# Patient Record
Sex: Female | Born: 1935 | Race: White | Hispanic: No | Smoking: Never smoker
Health system: Southern US, Community
[De-identification: ages and names within clinical notes are randomized; demographics above are authoritative.]

## PROBLEM LIST (undated history)

## (undated) DIAGNOSIS — K219 Gastro-esophageal reflux disease without esophagitis: Secondary | ICD-10-CM

## (undated) DIAGNOSIS — N189 Chronic kidney disease, unspecified: Secondary | ICD-10-CM

## (undated) DIAGNOSIS — E871 Hypo-osmolality and hyponatremia: Secondary | ICD-10-CM

## (undated) DIAGNOSIS — J4489 Other specified chronic obstructive pulmonary disease: Secondary | ICD-10-CM

## (undated) DIAGNOSIS — Z8719 Personal history of other diseases of the digestive system: Secondary | ICD-10-CM

## (undated) DIAGNOSIS — I201 Angina pectoris with documented spasm: Secondary | ICD-10-CM

## (undated) DIAGNOSIS — C189 Malignant neoplasm of colon, unspecified: Secondary | ICD-10-CM

## (undated) DIAGNOSIS — J449 Chronic obstructive pulmonary disease, unspecified: Secondary | ICD-10-CM

## (undated) DIAGNOSIS — J479 Bronchiectasis, uncomplicated: Secondary | ICD-10-CM

## (undated) DIAGNOSIS — I701 Atherosclerosis of renal artery: Secondary | ICD-10-CM

## (undated) DIAGNOSIS — I10 Essential (primary) hypertension: Secondary | ICD-10-CM

## (undated) HISTORY — DX: Angina pectoris with documented spasm: I20.1

## (undated) HISTORY — DX: Chronic obstructive pulmonary disease, unspecified: J44.9

## (undated) HISTORY — DX: Bronchiectasis, uncomplicated: J47.9

## (undated) HISTORY — DX: Hypo-osmolality and hyponatremia: E87.1

## (undated) HISTORY — DX: Essential (primary) hypertension: I10

## (undated) HISTORY — DX: Atherosclerosis of renal artery: I70.1

## (undated) HISTORY — DX: Malignant neoplasm of colon, unspecified: C18.9

## (undated) HISTORY — DX: Other specified chronic obstructive pulmonary disease: J44.89

## (undated) HISTORY — DX: Gastro-esophageal reflux disease without esophagitis: K21.9

## (undated) HISTORY — DX: Personal history of other diseases of the digestive system: Z87.19

## (undated) HISTORY — DX: Chronic kidney disease, unspecified: N18.9

---

## 2002-10-12 ENCOUNTER — Emergency Department (HOSPITAL_COMMUNITY): Admission: EM | Admit: 2002-10-12 | Discharge: 2002-10-12 | Payer: Self-pay | Admitting: Emergency Medicine

## 2002-10-12 ENCOUNTER — Encounter: Payer: Self-pay | Admitting: Emergency Medicine

## 2002-11-14 ENCOUNTER — Ambulatory Visit (HOSPITAL_COMMUNITY): Admission: RE | Admit: 2002-11-14 | Discharge: 2002-11-14 | Payer: Self-pay | Admitting: *Deleted

## 2002-11-14 ENCOUNTER — Encounter: Payer: Self-pay | Admitting: *Deleted

## 2003-03-05 ENCOUNTER — Inpatient Hospital Stay (HOSPITAL_COMMUNITY): Admission: EM | Admit: 2003-03-05 | Discharge: 2003-03-08 | Payer: Self-pay | Admitting: Emergency Medicine

## 2003-03-06 ENCOUNTER — Encounter: Payer: Self-pay | Admitting: Cardiology

## 2003-04-15 ENCOUNTER — Encounter: Admission: RE | Admit: 2003-04-15 | Discharge: 2003-04-15 | Payer: Self-pay | Admitting: Gastroenterology

## 2003-07-07 ENCOUNTER — Emergency Department (HOSPITAL_COMMUNITY): Admission: EM | Admit: 2003-07-07 | Discharge: 2003-07-07 | Payer: Self-pay | Admitting: Emergency Medicine

## 2003-07-26 ENCOUNTER — Ambulatory Visit (HOSPITAL_COMMUNITY): Admission: RE | Admit: 2003-07-26 | Discharge: 2003-07-26 | Payer: Self-pay | Admitting: Neurology

## 2003-08-16 ENCOUNTER — Encounter: Admission: RE | Admit: 2003-08-16 | Discharge: 2003-08-16 | Payer: Self-pay | Admitting: Gastroenterology

## 2003-10-10 ENCOUNTER — Encounter: Admission: RE | Admit: 2003-10-10 | Discharge: 2003-10-10 | Payer: Self-pay | Admitting: Rheumatology

## 2003-10-21 ENCOUNTER — Encounter (INDEPENDENT_AMBULATORY_CARE_PROVIDER_SITE_OTHER): Payer: Self-pay | Admitting: Specialist

## 2003-10-21 ENCOUNTER — Ambulatory Visit (HOSPITAL_COMMUNITY): Admission: RE | Admit: 2003-10-21 | Discharge: 2003-10-21 | Payer: Self-pay | Admitting: Gastroenterology

## 2003-11-08 ENCOUNTER — Inpatient Hospital Stay (HOSPITAL_COMMUNITY): Admission: RE | Admit: 2003-11-08 | Discharge: 2003-11-18 | Payer: Self-pay | Admitting: General Surgery

## 2003-11-08 ENCOUNTER — Encounter (INDEPENDENT_AMBULATORY_CARE_PROVIDER_SITE_OTHER): Payer: Self-pay | Admitting: *Deleted

## 2004-12-01 ENCOUNTER — Emergency Department (HOSPITAL_COMMUNITY): Admission: EM | Admit: 2004-12-01 | Discharge: 2004-12-02 | Payer: Self-pay | Admitting: Emergency Medicine

## 2005-01-21 ENCOUNTER — Ambulatory Visit: Payer: Self-pay | Admitting: Internal Medicine

## 2005-02-05 ENCOUNTER — Ambulatory Visit: Payer: Self-pay

## 2005-02-22 ENCOUNTER — Ambulatory Visit: Payer: Self-pay | Admitting: Internal Medicine

## 2005-04-16 ENCOUNTER — Ambulatory Visit: Payer: Self-pay

## 2005-04-19 ENCOUNTER — Ambulatory Visit: Payer: Self-pay | Admitting: Internal Medicine

## 2005-05-14 ENCOUNTER — Ambulatory Visit: Payer: Self-pay | Admitting: Internal Medicine

## 2005-09-10 ENCOUNTER — Encounter: Admission: RE | Admit: 2005-09-10 | Discharge: 2005-09-10 | Payer: Self-pay | Admitting: Rheumatology

## 2006-04-18 ENCOUNTER — Inpatient Hospital Stay (HOSPITAL_COMMUNITY): Admission: EM | Admit: 2006-04-18 | Discharge: 2006-04-25 | Payer: Self-pay | Admitting: Emergency Medicine

## 2006-05-13 ENCOUNTER — Ambulatory Visit: Payer: Self-pay | Admitting: Internal Medicine

## 2006-06-01 ENCOUNTER — Ambulatory Visit: Payer: Self-pay | Admitting: Internal Medicine

## 2006-06-01 LAB — CONVERTED CEMR LAB: BUN: 32 mg/dL — ABNORMAL HIGH (ref 6–23)

## 2006-07-08 ENCOUNTER — Ambulatory Visit: Payer: Self-pay | Admitting: Internal Medicine

## 2006-07-08 LAB — CONVERTED CEMR LAB
BUN: 26 mg/dL — ABNORMAL HIGH (ref 6–23)
Basophils Relative: 0.8 % (ref 0.0–1.0)
Calcium: 8.9 mg/dL (ref 8.4–10.5)
Chloride: 80 meq/L — ABNORMAL LOW (ref 96–112)
Creatinine, Ser: 1.4 mg/dL — ABNORMAL HIGH (ref 0.4–1.2)
Eosinophils Absolute: 0.1 10*3/uL (ref 0.0–0.6)
Eosinophils Relative: 0.8 % (ref 0.0–5.0)
GFR calc Af Amer: 46 mL/min
HCT: 43.9 % (ref 36.0–46.0)
MCHC: 32.5 g/dL (ref 30.0–36.0)
MCV: 96 fL (ref 78.0–100.0)
Neutro Abs: 5 10*3/uL (ref 1.4–7.7)
Potassium: 3.1 meq/L — ABNORMAL LOW (ref 3.5–5.1)
RBC: 4.58 M/uL (ref 3.87–5.11)
RDW: 14.5 % (ref 11.5–14.6)
Sed Rate: 18 mm/hr (ref 0–25)
Sodium: 119 meq/L — CL (ref 135–145)
WBC: 6.8 10*3/uL (ref 4.5–10.5)

## 2006-07-11 ENCOUNTER — Ambulatory Visit: Payer: Self-pay | Admitting: Internal Medicine

## 2006-07-11 LAB — CONVERTED CEMR LAB
CO2: 33 meq/L — ABNORMAL HIGH (ref 19–32)
Calcium: 9.1 mg/dL (ref 8.4–10.5)
Glucose, Bld: 115 mg/dL — ABNORMAL HIGH (ref 70–99)
Sodium: 129 meq/L — ABNORMAL LOW (ref 135–145)

## 2006-07-20 ENCOUNTER — Ambulatory Visit: Payer: Self-pay | Admitting: Pulmonary Disease

## 2006-07-20 ENCOUNTER — Ambulatory Visit: Payer: Self-pay | Admitting: Internal Medicine

## 2006-07-20 LAB — CONVERTED CEMR LAB
BUN: 29 mg/dL — ABNORMAL HIGH (ref 6–23)
CO2: 29 meq/L (ref 19–32)
Creatinine, Ser: 1.4 mg/dL — ABNORMAL HIGH (ref 0.4–1.2)
Glucose, Bld: 115 mg/dL — ABNORMAL HIGH (ref 70–99)

## 2006-08-01 ENCOUNTER — Ambulatory Visit: Payer: Self-pay | Admitting: Cardiology

## 2006-08-16 ENCOUNTER — Ambulatory Visit: Payer: Self-pay | Admitting: Pulmonary Disease

## 2006-08-18 ENCOUNTER — Encounter: Payer: Self-pay | Admitting: Pulmonary Disease

## 2006-11-07 ENCOUNTER — Ambulatory Visit: Payer: Self-pay | Admitting: Pulmonary Disease

## 2007-05-19 ENCOUNTER — Telehealth (INDEPENDENT_AMBULATORY_CARE_PROVIDER_SITE_OTHER): Payer: Self-pay | Admitting: *Deleted

## 2007-06-02 ENCOUNTER — Ambulatory Visit: Payer: Self-pay | Admitting: Pulmonary Disease

## 2007-06-02 DIAGNOSIS — J479 Bronchiectasis, uncomplicated: Secondary | ICD-10-CM

## 2007-06-02 DIAGNOSIS — J449 Chronic obstructive pulmonary disease, unspecified: Secondary | ICD-10-CM

## 2007-06-06 DIAGNOSIS — A310 Pulmonary mycobacterial infection: Secondary | ICD-10-CM | POA: Insufficient documentation

## 2007-07-07 ENCOUNTER — Ambulatory Visit: Payer: Self-pay | Admitting: Internal Medicine

## 2007-07-07 LAB — CONVERTED CEMR LAB
BUN: 29 mg/dL — ABNORMAL HIGH (ref 6–23)
Basophils Relative: 0.3 % (ref 0.0–1.0)
CO2: 26 meq/L (ref 19–32)
Calcium: 9.2 mg/dL (ref 8.4–10.5)
Chloride: 102 meq/L (ref 96–112)
GFR calc Af Amer: 49 mL/min
GFR calc non Af Amer: 41 mL/min
Glucose, Bld: 96 mg/dL (ref 70–99)
Lymphocytes Relative: 13.5 % (ref 12.0–46.0)
MCV: 81.2 fL (ref 78.0–100.0)
Monocytes Absolute: 1.1 10*3/uL — ABNORMAL HIGH (ref 0.2–0.7)
Monocytes Relative: 13 % — ABNORMAL HIGH (ref 3.0–11.0)
Potassium: 4.6 meq/L (ref 3.5–5.1)
RBC: 3.65 M/uL — ABNORMAL LOW (ref 3.87–5.11)
Sodium: 137 meq/L (ref 135–145)

## 2007-07-21 ENCOUNTER — Ambulatory Visit: Payer: Self-pay

## 2007-09-09 ENCOUNTER — Ambulatory Visit: Payer: Self-pay | Admitting: Pulmonary Disease

## 2007-09-09 ENCOUNTER — Inpatient Hospital Stay (HOSPITAL_COMMUNITY): Admission: EM | Admit: 2007-09-09 | Discharge: 2007-09-13 | Payer: Self-pay | Admitting: Emergency Medicine

## 2007-09-21 ENCOUNTER — Ambulatory Visit: Payer: Self-pay | Admitting: Internal Medicine

## 2007-09-21 LAB — CONVERTED CEMR LAB
Basophils Absolute: 0 10*3/uL (ref 0.0–0.1)
Basophils Relative: 0 % (ref 0.0–1.0)
CO2: 26 meq/L (ref 19–32)
Eosinophils Relative: 2.3 % (ref 0.0–5.0)
Glucose, Bld: 117 mg/dL — ABNORMAL HIGH (ref 70–99)
Hemoglobin: 11.7 g/dL — ABNORMAL LOW (ref 12.0–15.0)
Lymphocytes Relative: 9.1 % — ABNORMAL LOW (ref 12.0–46.0)
MCHC: 33.1 g/dL (ref 30.0–36.0)
MCV: 86 fL (ref 78.0–100.0)
Monocytes Absolute: 2.1 10*3/uL — ABNORMAL HIGH (ref 0.1–1.0)
RBC: 4.12 M/uL (ref 3.87–5.11)
RDW: 17 % — ABNORMAL HIGH (ref 11.5–14.6)

## 2007-09-23 ENCOUNTER — Ambulatory Visit: Payer: Self-pay | Admitting: Internal Medicine

## 2007-09-23 ENCOUNTER — Inpatient Hospital Stay (HOSPITAL_COMMUNITY): Admission: EM | Admit: 2007-09-23 | Discharge: 2007-09-25 | Payer: Self-pay | Admitting: Emergency Medicine

## 2007-09-26 ENCOUNTER — Telehealth (INDEPENDENT_AMBULATORY_CARE_PROVIDER_SITE_OTHER): Payer: Self-pay | Admitting: *Deleted

## 2008-01-21 IMAGING — CR DG THORACIC SPINE 3V
4 series · 4 of 4 positions shown · non-contrast
Comparison: [REDACTED] lateral chest x-ray 12/01/04.

CLINICAL DATA: Back pain.  
 THORACIC SPINE WITH SWIMMER?S THREE VIEWS:

[t t-spine lat]
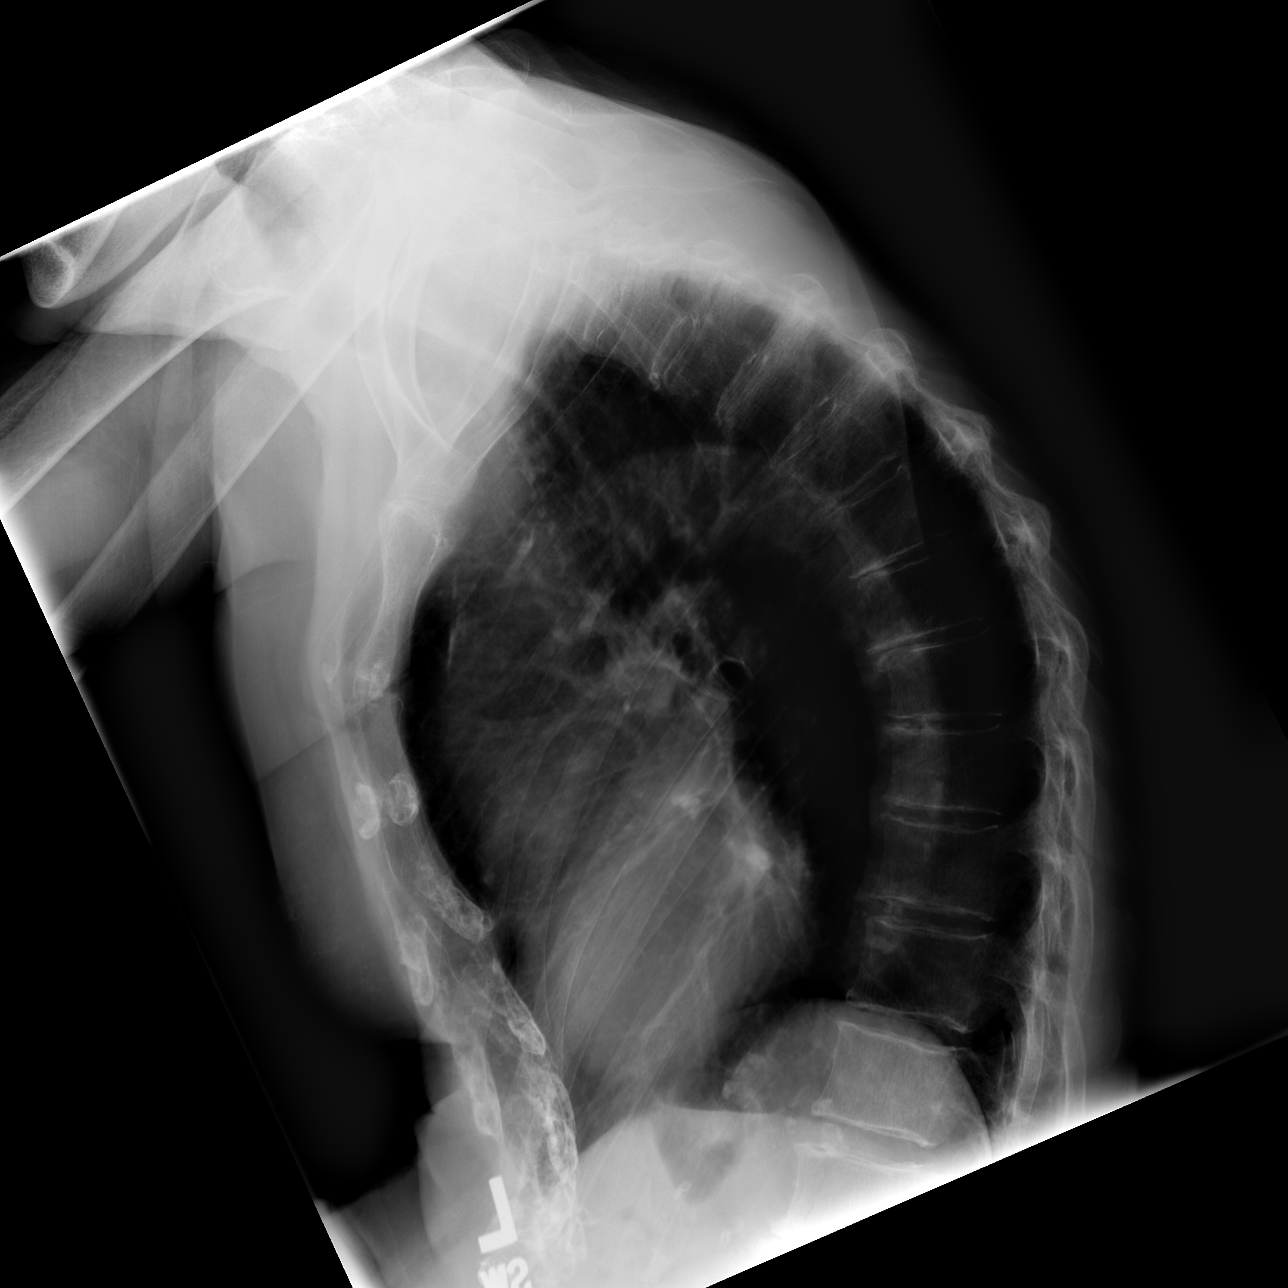

[t t-spine lat *]
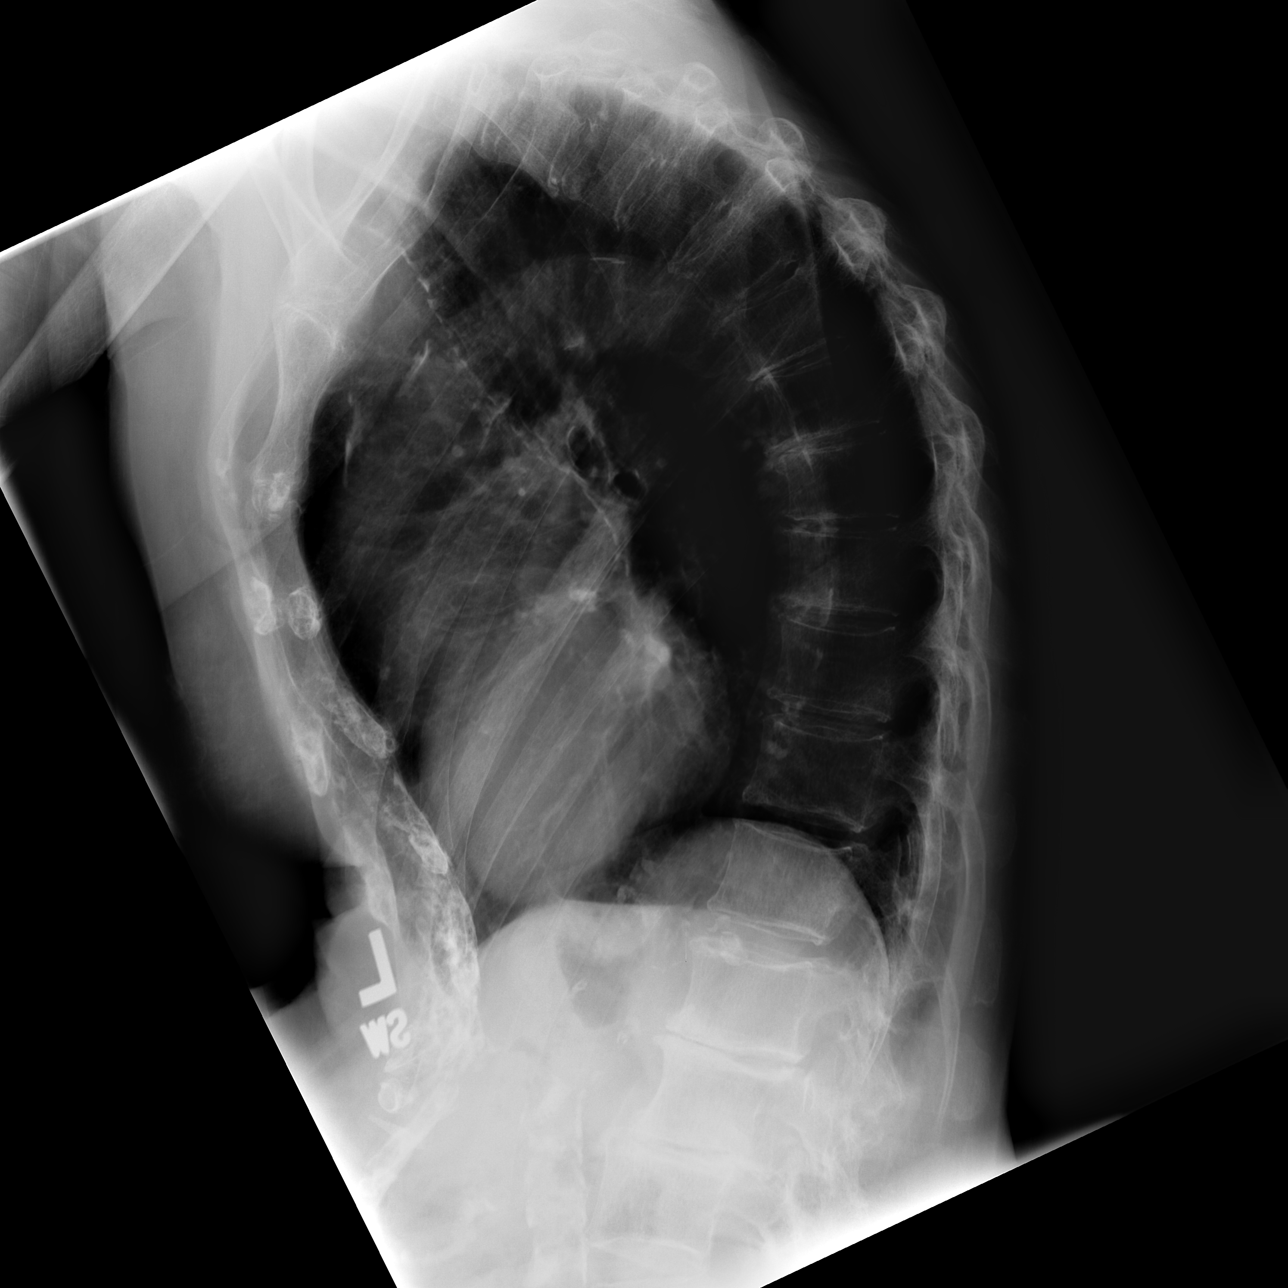

[t swimmers]
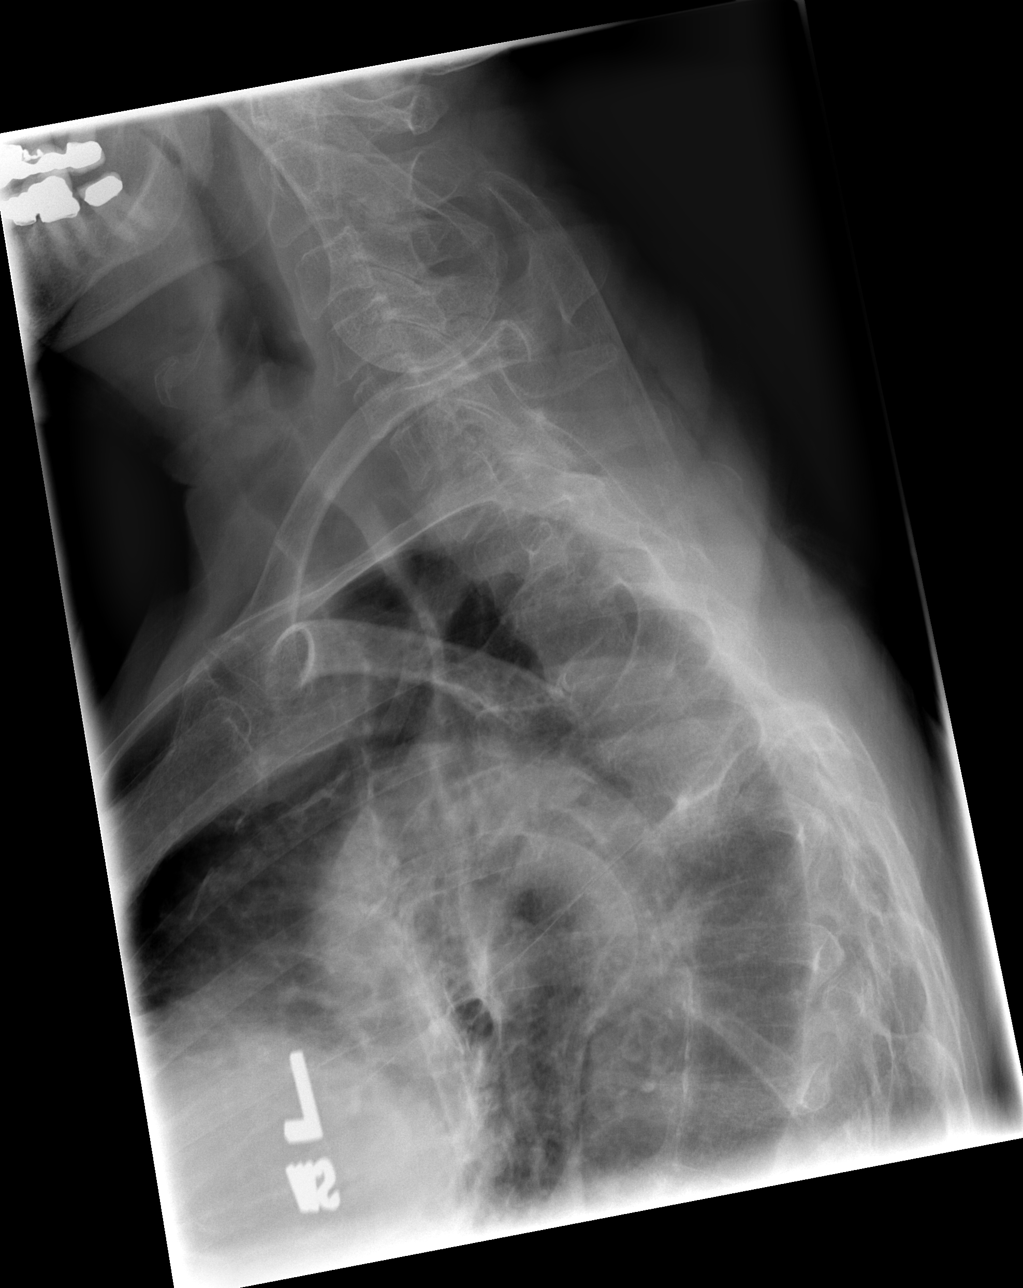

[t t-spine a.p.]
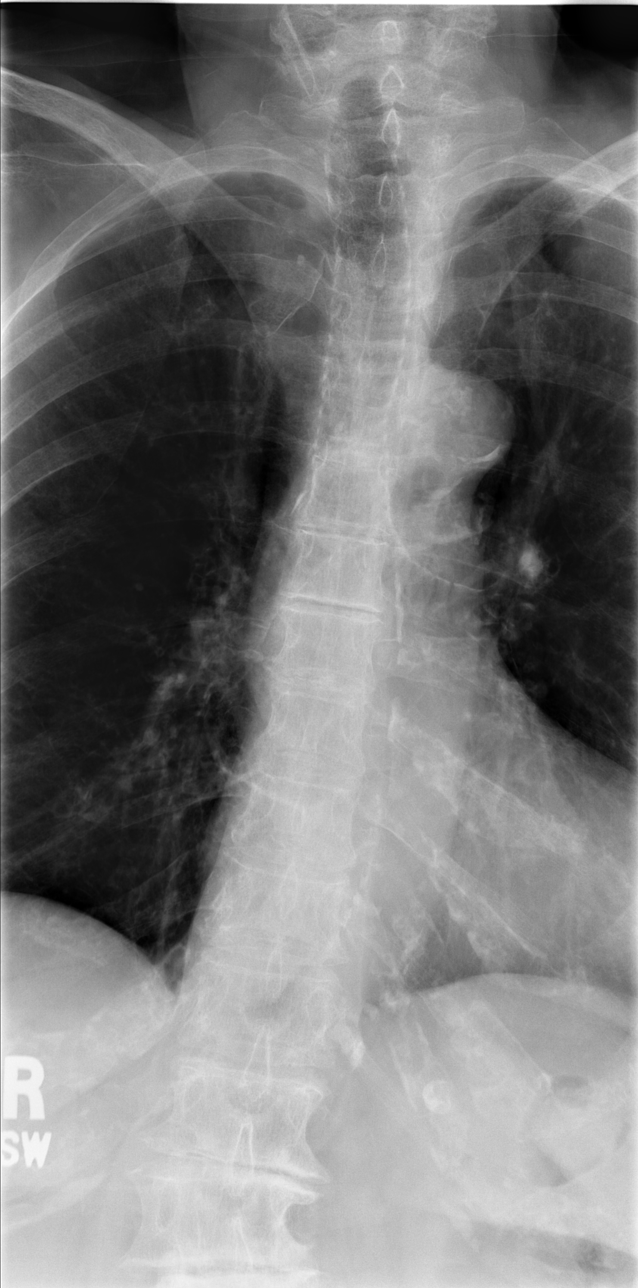

[4 of 4 positions shown; findings below may reference images not displayed]

FINDINGS: No change in generalized osteopenia and dorsal kyphosis with slight pectus excavatum chest wall deformity.  Slight degenerative calcification of discs is noted with degenerative disc space narrowing C4-5 through C7-T1 of cervical spine.  No osseous lesion nor compression fracture deformity is seen with normal posterior vertebral alignment.
IMPRESSION: 1.  Stable dorsal slight kyphosis and diffuse osteopenia. 
 2.  Mild degenerative disc disease throughout from C4-5 through T11-12. 
 3.  Otherwise no significant abnormality.

## 2008-03-25 ENCOUNTER — Ambulatory Visit: Payer: Self-pay | Admitting: Internal Medicine

## 2008-03-25 LAB — CONVERTED CEMR LAB
BUN: 34 mg/dL — ABNORMAL HIGH (ref 6–23)
CO2: 27 meq/L (ref 19–32)
GFR calc Af Amer: 54 mL/min
GFR calc non Af Amer: 45 mL/min
Glucose, Bld: 94 mg/dL (ref 70–99)
Pro B Natriuretic peptide (BNP): 101 pg/mL — ABNORMAL HIGH (ref 0.0–100.0)
Sodium: 138 meq/L (ref 135–145)

## 2008-08-02 ENCOUNTER — Telehealth: Payer: Self-pay | Admitting: Pulmonary Disease

## 2008-09-12 ENCOUNTER — Ambulatory Visit: Payer: Self-pay | Admitting: Pulmonary Disease

## 2008-09-13 LAB — CONVERTED CEMR LAB
ALT: 21 units/L (ref 0–35)
CO2: 30 meq/L (ref 19–32)
Chloride: 105 meq/L (ref 96–112)
GFR calc non Af Amer: 40.73 mL/min (ref >60)
Glucose, Bld: 117 mg/dL — ABNORMAL HIGH (ref 70–99)
Potassium: 4.6 meq/L (ref 3.5–5.1)
Total Protein: 6.6 g/dL (ref 6.0–8.3)
Uric Acid, Serum: 3.1 mg/dL (ref 2.4–7.0)

## 2008-10-04 ENCOUNTER — Ambulatory Visit: Payer: Self-pay | Admitting: Internal Medicine

## 2008-10-04 DIAGNOSIS — I201 Angina pectoris with documented spasm: Secondary | ICD-10-CM

## 2008-10-04 DIAGNOSIS — E871 Hypo-osmolality and hyponatremia: Secondary | ICD-10-CM

## 2008-10-04 DIAGNOSIS — I119 Hypertensive heart disease without heart failure: Secondary | ICD-10-CM | POA: Insufficient documentation

## 2008-10-14 ENCOUNTER — Telehealth: Payer: Self-pay | Admitting: Pulmonary Disease

## 2009-02-17 ENCOUNTER — Ambulatory Visit: Payer: Self-pay | Admitting: Pulmonary Disease

## 2009-02-17 ENCOUNTER — Telehealth: Payer: Self-pay | Admitting: Pulmonary Disease

## 2009-02-17 DIAGNOSIS — J471 Bronchiectasis with (acute) exacerbation: Secondary | ICD-10-CM

## 2009-02-20 ENCOUNTER — Telehealth (INDEPENDENT_AMBULATORY_CARE_PROVIDER_SITE_OTHER): Payer: Self-pay | Admitting: *Deleted

## 2009-02-21 ENCOUNTER — Ambulatory Visit: Payer: Self-pay | Admitting: Pulmonary Disease

## 2009-02-21 LAB — CONVERTED CEMR LAB
BUN: 28 mg/dL — ABNORMAL HIGH (ref 6–23)
CO2: 25 meq/L (ref 19–32)
Creatinine, Ser: 1.2 mg/dL (ref 0.4–1.2)
GFR calc non Af Amer: 44.63 mL/min (ref >60)
Glucose, Bld: 129 mg/dL — ABNORMAL HIGH (ref 70–99)
Potassium: 3.2 meq/L — ABNORMAL LOW (ref 3.5–5.1)

## 2009-02-22 ENCOUNTER — Inpatient Hospital Stay (HOSPITAL_COMMUNITY): Admission: EM | Admit: 2009-02-22 | Discharge: 2009-02-24 | Payer: Self-pay | Admitting: Emergency Medicine

## 2009-02-26 ENCOUNTER — Telehealth: Payer: Self-pay | Admitting: Pulmonary Disease

## 2009-02-26 ENCOUNTER — Encounter (INDEPENDENT_AMBULATORY_CARE_PROVIDER_SITE_OTHER): Payer: Self-pay | Admitting: *Deleted

## 2009-03-04 ENCOUNTER — Ambulatory Visit: Payer: Self-pay | Admitting: Pulmonary Disease

## 2009-03-14 ENCOUNTER — Ambulatory Visit: Payer: Self-pay | Admitting: Internal Medicine

## 2009-03-14 DIAGNOSIS — R0989 Other specified symptoms and signs involving the circulatory and respiratory systems: Secondary | ICD-10-CM | POA: Insufficient documentation

## 2009-03-26 ENCOUNTER — Ambulatory Visit: Payer: Self-pay

## 2009-03-26 ENCOUNTER — Encounter: Payer: Self-pay | Admitting: Internal Medicine

## 2009-03-27 ENCOUNTER — Encounter: Payer: Self-pay | Admitting: Internal Medicine

## 2009-04-08 ENCOUNTER — Telehealth: Payer: Self-pay | Admitting: Internal Medicine

## 2009-04-11 ENCOUNTER — Telehealth: Payer: Self-pay | Admitting: Internal Medicine

## 2009-04-15 ENCOUNTER — Ambulatory Visit: Payer: Self-pay | Admitting: Internal Medicine

## 2009-04-21 LAB — CONVERTED CEMR LAB
CO2: 26 meq/L (ref 19–32)
Calcium: 9.2 mg/dL (ref 8.4–10.5)
Chloride: 104 meq/L (ref 96–112)
GFR calc non Af Amer: 49.33 mL/min (ref >60)
Glucose, Bld: 95 mg/dL (ref 70–99)

## 2009-06-17 ENCOUNTER — Ambulatory Visit: Payer: Self-pay | Admitting: Pulmonary Disease

## 2009-06-18 LAB — CONVERTED CEMR LAB
CO2: 29 meq/L (ref 19–32)
Calcium: 9 mg/dL (ref 8.4–10.5)
Creatinine, Ser: 1.2 mg/dL (ref 0.4–1.2)
Glucose, Bld: 100 mg/dL — ABNORMAL HIGH (ref 70–99)
Potassium: 3.7 meq/L (ref 3.5–5.1)

## 2009-06-20 ENCOUNTER — Telehealth (INDEPENDENT_AMBULATORY_CARE_PROVIDER_SITE_OTHER): Payer: Self-pay | Admitting: *Deleted

## 2009-07-01 ENCOUNTER — Encounter: Payer: Self-pay | Admitting: Internal Medicine

## 2009-07-17 ENCOUNTER — Encounter: Payer: Self-pay | Admitting: Internal Medicine

## 2009-07-21 ENCOUNTER — Encounter (INDEPENDENT_AMBULATORY_CARE_PROVIDER_SITE_OTHER): Payer: Self-pay | Admitting: *Deleted

## 2009-09-08 ENCOUNTER — Ambulatory Visit: Payer: Self-pay | Admitting: Internal Medicine

## 2009-10-03 ENCOUNTER — Ambulatory Visit: Payer: Self-pay | Admitting: Pulmonary Disease

## 2009-12-18 ENCOUNTER — Telehealth: Payer: Self-pay | Admitting: Internal Medicine

## 2010-01-21 ENCOUNTER — Telehealth (INDEPENDENT_AMBULATORY_CARE_PROVIDER_SITE_OTHER): Payer: Self-pay | Admitting: *Deleted

## 2010-01-30 ENCOUNTER — Ambulatory Visit: Payer: Self-pay | Admitting: Pulmonary Disease

## 2010-02-26 ENCOUNTER — Encounter: Payer: Self-pay | Admitting: Internal Medicine

## 2010-03-10 ENCOUNTER — Encounter: Admission: RE | Admit: 2010-03-10 | Discharge: 2010-03-10 | Payer: Self-pay | Admitting: Neurology

## 2010-05-31 ENCOUNTER — Encounter: Payer: Self-pay | Admitting: Gastroenterology

## 2010-06-05 ENCOUNTER — Telehealth: Payer: Self-pay | Admitting: Internal Medicine

## 2010-06-07 LAB — CONVERTED CEMR LAB
BUN: 30 mg/dL — ABNORMAL HIGH (ref 6–23)
CO2: 27 meq/L (ref 19–32)
Chloride: 105 meq/L (ref 96–112)
Creatinine, Ser: 1.1 mg/dL (ref 0.4–1.2)
Magnesium: 2.4 mg/dL (ref 1.5–2.5)
Potassium: 3.7 meq/L (ref 3.5–5.1)

## 2010-06-09 NOTE — Assessment & Plan Note (Signed)
Summary: acute sick visit for bronchiectasis flare   Copy to:  [place] Primary Provider/Referring Provider:  Deboraha Sprang at [facility]  CC:  Pt is here for an acute sick visit.   Pt c/o increased sob with exertion x 1.5 weeks.  Pt also c/o coughing up large amounts of cream colored sputum and also c/o nasal congestion. Pt wonders if sodium levels may be too low.  .  History of Present Illness: the pt comes in today for an acute visit.  She has known bronchiectasis, and gives 1-2 week h/o increased chest congestion with cough of purulent mucus.  She has had some increase in sob, but no fever or sweats.  She does have a h/o recurrent hyponatremia related to siadh whenever she gets a pulmonary infection, and the daughter is concerned that she has had a little more confusion of late.  She is followed by Dr. Arrie Aran for her hyponatremia, and he has recommended tightening fluid restriction whenever she gets infected.  Medications Prior to Update: 1)  Isosorbide Mononitrate Cr 30 Mg Xr24h-Tab (Isosorbide Mononitrate) .... Take 1 Tablet By Mouth Once A Day 2)  Advair Diskus 500-50 Mcg/dose  Misc (Fluticasone-Salmeterol) .... Inhale 1 Puff Two Times A Day 3)  Norvasc 5 Mg  Tabs (Amlodipine Besylate) .... Take 1 Tablet By Mouth Once A Day 4)  Plavix 75 Mg  Tabs (Clopidogrel Bisulfate) .... Take 1 Tablet By Mouth Once A Day 5)  Folic Acid 1 Mg  Tabs (Folic Acid) .... Take 1 Tablet By Mouth Once A Day 6)  Pepcid 40 Mg  Tabs (Famotidine) .... Take 1 Tablet By Mouth Once A Day 7)  Allopurinol 300 Mg  Tabs (Allopurinol) .... Take 1 Tab By Mouth Every Other Day 8)  Geritol Complete  Tabs (Iron-Vitamins) .... Take 1 Tablet By Mouth Two Times A Day 9)  Optivar 0.05 %  Soln (Azelastine Hcl) .... Instill 1 Drop in Each Eye Two Times A Day 10)  Ocuvite   Tabs (Multiple Vitamins-Minerals) .... Take 2 Tabs By Mouth Once Daily 11)  Tylenol Pm Extra Strength 500-25 Mg  Tabs (Diphenhydramine-Apap (Sleep)) .... Take 1/2  Tab By Mouth At Bedtime As Needed 12)  Nitroquick 0.4 Mg  Subl (Nitroglycerin) .... Use As Needed 13)  Colcrys 0.6 Mg Tabs (Colchicine) .... Every Other Day 14)  Lomotil 2.5-0.025 Mg Tabs (Diphenoxylate-Atropine) .... Take By Mouth As Needed 15)  Siadh .Marland KitchenMarland Kitchen. 1500cc/day Keep Sodium in Range  Allergies (verified): 1)  ! Ampicillin 2)  ! Penicillin 3)  ! Tetracycline 4)  ! * Protonix 5)  ! * Nexium 6)  ! * Corn Mold 7)  ! * Avelox  Review of Systems       The patient complains of shortness of breath with activity, productive cough, and nasal congestion/difficulty breathing through nose.  The patient denies shortness of breath at rest, non-productive cough, coughing up blood, chest pain, irregular heartbeats, acid heartburn, indigestion, loss of appetite, weight change, abdominal pain, difficulty swallowing, sore throat, tooth/dental problems, headaches, sneezing, itching, ear ache, anxiety, depression, hand/feet swelling, joint stiffness or pain, rash, change in color of mucus, and fever.    Vital Signs:  Patient profile:   77 year old female Height:      63 inches Weight:      144 pounds BMI:     25.60 O2 Sat:      91 % on Room air Temp:     97.5 degrees F oral Pulse rate:   83 /  minute BP sitting:   138 / 74  (left arm) Cuff size:   regular  Vitals Entered By: Arman Filter LPN (Oct 03, 2009 11:13 AM)  O2 Flow:  Room air CC: Pt is here for an acute sick visit.   Pt c/o increased sob with exertion x 1.5 weeks.  Pt also c/o coughing up large amounts of cream colored sputum and also c/o nasal congestion. Pt wonders if sodium levels may be too low.   Comments Medications reviewed with patient Arman Filter LPN  Oct 03, 2009 11:20 AM    Physical Exam  General:  frail female in nad Nose:  no purulence or drainage. Lungs:  minimal basilar crackles, no wheezing Heart:  rrr, no mrg Extremities:  no edema or cyanosis Neurologic:  alert and oriented, moves all 4.   Impression &  Recommendations:  Problem # 1:  BRONCHIECTASIS WITH ACUTE EXACERBATION (ICD-494.1) the pt's history is most c/w a flare of her bronchiectasis.  She will need a course of abx, and is to work on pulmonary toilet.  She has done well with cipro in the past.  Problem # 2:  HYPONATREMIA (ICD-276.1) She has a history of hyponatremia whenever she gets a pulmonary infecton.  Will check sodium level today, and have asked her to follow renal's recommendation for fluid restriction.  Medications Added to Medication List This Visit: 1)  Cipro 500 Mg Tabs (Ciprofloxacin hcl) .... Take one tablet po twice a day  Other Orders: Est. Patient Level IV ([address]) Prescription Created Electronically [phone]) TLB-BMP (Basic Metabolic Panel-BMET) (80048-METABOL)  Patient Instructions: 1)  will check sodium today, and let you know the results. 2)  will send prescription for cipro to drug store...see directions. 3)  can take mucinex dm extra strength one in am and pm until better. 4)  cut fluid back to 1200cc a day as recommended by your kidney md. 5)  keep routine followup with me, but call if not improving.   Prescriptions: CIPRO 500 MG  TABS (CIPROFLOXACIN HCL) Take one tablet po twice a day  #14 x 0   Entered and Authorized by:   Barbaraann Share MD   Signed by:   Barbaraann Share MD on 10/03/2009   Method used:   Electronically to        [facility]  [address] # [id number]* (retail)       [address]       [place], [place]  [address]       Ph: [phone] or [phone]       Fax: [phone]   RxID:   [id number]

## 2010-06-09 NOTE — Assessment & Plan Note (Signed)
Summary: rov for bronchiectasis   Visit Type:  Follow-up Copy to:  [place] Primary Provider/Referring Provider:  Deboraha Sprang at [facility]  CC:  follow Pt states her breathing is doing okay. Pt states she is not having any problems or concerns today. Pt states she hasn't smoke since  the 1920's.  History of Present Illness: the pt comes in today for f/u of her known bronchiectasis.  She has been doing well, with no recent acute exacerbation or worsening pulmonary symptoms.  She is maintaining on advair, and denies any cough or congestion.  She is trying to stay active as well.    Current Medications (verified): 1)  Isosorbide Mononitrate Cr 30 Mg Xr24h-Tab (Isosorbide Mononitrate) .... Take 1 Tablet By Mouth Once A Day 2)  Advair Diskus 500-50 Mcg/dose  Misc (Fluticasone-Salmeterol) .... Inhale 1 Puff Two Times A Day 3)  Norvasc 5 Mg  Tabs (Amlodipine Besylate) .... Take 1 Tablet By Mouth Once A Day 4)  Plavix 75 Mg  Tabs (Clopidogrel Bisulfate) .... Take 1 Tablet By Mouth Once A Day 5)  Folic Acid 1 Mg  Tabs (Folic Acid) .... Take 1 Tablet By Mouth Once A Day 6)  Pepcid 40 Mg  Tabs (Famotidine) .... Take 1 Tablet By Mouth Once A Day 7)  Allopurinol 300 Mg  Tabs (Allopurinol) .... Take 1 Tab By Mouth Every Other Day 8)  Geritol Complete  Tabs (Iron-Vitamins) .... Take 1 Tablet By Mouth Two Times A Day 9)  Optivar 0.05 %  Soln (Azelastine Hcl) .... Instill 1 Drop in Each Eye Two Times A Day 10)  Ocuvite   Tabs (Multiple Vitamins-Minerals) .... Take 2 Tabs By Mouth Once Daily 11)  Tylenol Pm Extra Strength 500-25 Mg  Tabs (Diphenhydramine-Apap (Sleep)) .... Take 1/2 Tab By Mouth At Bedtime As Needed 12)  Nitroquick 0.4 Mg  Subl (Nitroglycerin) .... Use As Needed 13)  Colcrys 0.6 Mg Tabs (Colchicine) .... Every Other Day 14)  Lomotil 2.5-0.025 Mg Tabs (Diphenoxylate-Atropine) .... Take By Mouth As Needed 15)  Siadh .... 1300cc/day Keep Sodium in Range  Allergies (verified): 1)  !  Ampicillin 2)  ! Penicillin 3)  ! Tetracycline 4)  ! * Protonix 5)  ! * Nexium 6)  ! * Corn Mold 7)  ! * Avelox  Past History:  Past medical, surgical, family and social histories (including risk factors) reviewed, and no changes noted (except as noted below).  Past Medical History: Reviewed history from 02/21/2009 and no changes required. Current Problems:  Prinzmetal's angina  Cath in 2000, no significant CAD Renal artery stenosis Hx. 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  Social History: Reviewed history from 09/08/2009 and no changes required. No tobacco No EtOH  Review of Systems       The patient complains of shortness of breath with activity.  The patient denies shortness of breath at rest, productive cough, non-productive cough, coughing up blood, chest pain, irregular heartbeats, acid heartburn, indigestion, loss of appetite, weight change, abdominal pain, difficulty swallowing, sore throat, tooth/dental problems, headaches, nasal congestion/difficulty breathing through nose, sneezing, itching, ear ache, anxiety, depression, hand/feet swelling, joint stiffness or pain, rash, change in color of mucus, and fever.    Vital Signs:  Patient profile:   77 year old female Height:      63 inches Weight:  141.50 pounds BMI:     25.16 O2 Sat:      96 % on Room air Temp:     97.9 degrees F oral Pulse rate:   90 / minute BP sitting:   156 / 84  (left arm) Cuff size:   regular  Vitals Entered By: Carver Fila (January 30, 2010 2:56 PM)  O2 Flow:  Room air CC: follow Pt states her breathing is doing okay. Pt states she is not having any problems or concerns today. Pt states she hasn't smoke since  the 1920's Comments meds and  allergies updated Phone number updated Carver Fila  January 30, 2010 2:57 PM    Physical Exam  General:  wd female in nad Lungs:  decreased bs with a few basilar crackles. no wheezing or rhonchi Heart:  rrr, 2/6 sem Extremities:  no significant edema or cyanosis  Neurologic:  alert and oriented, moves all 4.   Impression & Recommendations:  Problem # 1:  BRONCHIECTASIS WITHOUT ACUTE EXACERBATION (ICD-494.0) the pt seems to be doing well with respect to her bronchiectasis.  She has not had a recent flareup, and feels that she is breathing fairly well on her advair.  I have asked her to stay as active as she can, and will give her a flu shot today.  She will f/u with me in 6mos and as needed.  Medications Added to Medication List This Visit: 1)  Siadh  .... 1300cc/day keep sodium in range  Other Orders: Est. Patient Level III ([address])  Patient Instructions: 1)  will give you the flu shot today 2)  no change in meds. 3)  followup with me in 6mos  Prescriptions: ADVAIR DISKUS 500-50 MCG/DOSE  MISC (FLUTICASONE-SALMETEROL) Inhale 1 puff two times a day  #1 x 12   Entered and Authorized by:   Barbaraann Share MD   Signed by:   Barbaraann Share MD on 01/30/2010   Method used:   Print then Give to Patient   RxID:   [id number]

## 2010-06-09 NOTE — Progress Notes (Signed)
Summary: rx  Phone Note Call from Patient Call back at Home Phone [phone]   Caller: Daughter--[phone] Call For: clance Reason for Call: Refill Medication Summary of Call: Patient's daughter calling requesting refill advair 500/50mg .  [facility]  Daughter scheduled an up coming appt. for mother on 9/23. Initial call taken by: Lehman Prom,  January 21, 2010 12:29 PM  Follow-up for Phone Call        Spoke with pt's daughter and advised that rx refill was sent to pharm and that pt needs to keep 01/30/10 for refills. Follow-up by: Vernie Murders,  January 21, 2010 12:47 PM    Prescriptions: ADVAIR DISKUS 500-50 MCG/DOSE  MISC (FLUTICASONE-SALMETEROL) Inhale 1 puff two times a day  #1 x 0   Entered by:   Vernie Murders   Authorized by:   Barbaraann Share MD   Signed by:   Vernie Murders on 01/21/2010   Method used:   Electronically to        [facility]  [address] # [id number]* (retail)       [address]       [place], [place]  [address]       Ph: [phone] or [phone]       Fax: [phone]   RxID:   [id number]

## 2010-06-09 NOTE — Progress Notes (Signed)
Summary: reaction  Phone Note Call from Patient   Caller: Daughter ginny Call For: clance Summary of Call: pt unable to tolerate levaquin would like [facility]else [facility] [address] Initial call taken by: Rickard Patience,  June 20, 2009 10:48 AM  Follow-up for Phone Call        Pt's daughter advised pt c/o tremors, nausea, light headed, restlessness a few hours after first dose of Levaquin on Tuesday evening. Mariane Duval believes pt has tried Cipro in the past but is not sure. Pt is "a bit better" Please advise. Thanks. Zackery Barefoot CMA  June 20, 2009 10:57 AM   Additional Follow-up for Phone Call Additional follow up Details #1::        can try cipro 500mg  one in am and pm for 7 days #14, no fills Additional Follow-up by: Barbaraann Share MD,  June 20, 2009 5:30 PM    Additional Follow-up for Phone Call Additional follow up Details #2::    Spoke with [facility] and made aware that we will call in rx for cipro 500 mg 1 two times a day.  Rx was sent to pharm electronically.    New/Updated Medications: CIPROFLOXACIN HCL 500 MG TABS (CIPROFLOXACIN HCL) 1 by mouth two times a day Prescriptions: CIPROFLOXACIN HCL 500 MG TABS (CIPROFLOXACIN HCL) 1 by mouth two times a day  #14 x 0   Entered by:   Vernie Murders   Authorized by:   Barbaraann Share MD   Signed by:   Vernie Murders on 06/23/2009   Method used:   Electronically to        [facility]  [address] [id number]* (retail)       [address]       [place], [place]  [address]       Ph: [phone] or [phone]       Fax: [phone]   RxID:   [id number]

## 2010-06-09 NOTE — Letter (Signed)
Summary: Appointment - Reminder 2  [facility], Main Office  [address]   [place], [place] [address]   Phone: [phone]  Fax: [phone]     July 21, 2009 MRN: [id number]   [facility] [address] [place], [place]  [address]   Dear Ms. Gerling,  Our records indicate that it is time to schedule a follow-up appointment with Dr. Tenny Craw. It is very important that we reach you to schedule this appointment. We look forward to participating in your health care needs. Please contact us at the number listed above at your earliest convenience to schedule your appointment.  If you are unable to make an appointment at this time, give [place] a call so we can update our records.     Sincerely,   Migdalia Dk [facility] Scheduling Team

## 2010-06-09 NOTE — Assessment & Plan Note (Signed)
Summary: 6 month rov/sl  Medications Added * SIADH 1500cc/day keep sodium in range        Visit Type:  6 mos Primary Provider:  Deboraha Sprang at [facility]  CC:  shortness of breath sometimes.  History of Present Illness:  Patient is a 77 year old with a history of Prinzmetal's angina, RA stenosis, GE reflux. This fall she was treated for pneumonia..  After this she developed weakness, confusion.  Found to be profoundly hyponatremic.   since I saw her last she has done well.  Her breathing is good.  SHe denies chest pains.  No significant dizziness.  Daughter reports no significant confusion.  Problems Prior to Update: 1)  Carotid Bruit, Left  (ICD-785.9) 2)  Bronchiectasis With Acute Exacerbation  (ICD-494.1) 3)  Prinzmetal Angina  (ICD-413.1) 4)  Hyponatremia  (ICD-276.1) 5)  Ben Htn Heart Disease Without Heart Fail  (ICD-402.10) 6)  Pulmonary Diseases Due To Other Mycobacteria  (ICD-031.0) 7)  Bronchiectasis Without Acute Exacerbation  (ICD-494.0) 8)  Obstructive Chronic Bronchitis Without Exacerbat  (ICD-491.20)  Current Medications (verified): 1)  Isosorbide Mononitrate Cr 30 Mg Xr24h-Tab (Isosorbide Mononitrate) .... Take 1 Tablet By Mouth Once A Day 2)  Advair Diskus 500-50 Mcg/dose  Misc (Fluticasone-Salmeterol) .... Inhale 1 Puff Two Times A Day 3)  Norvasc 5 Mg  Tabs (Amlodipine Besylate) .... Take 1 Tablet By Mouth Once A Day 4)  Plavix 75 Mg  Tabs (Clopidogrel Bisulfate) .... Take 1 Tablet By Mouth Once A Day 5)  Folic Acid 1 Mg  Tabs (Folic Acid) .... Take 1 Tablet By Mouth Once A Day 6)  Pepcid 40 Mg  Tabs (Famotidine) .... Take 1 Tablet By Mouth Once A Day 7)  Allopurinol 300 Mg  Tabs (Allopurinol) .... Take 1 Tab By Mouth Every Other Day 8)  Geritol Complete  Tabs (Iron-Vitamins) .... Take 1 Tablet By Mouth Two Times A Day 9)  Optivar 0.05 %  Soln (Azelastine Hcl) .... Instill 1 Drop in Each Eye Two Times A Day 10)  Ocuvite   Tabs (Multiple Vitamins-Minerals) .... Take 2  Tabs By Mouth Once Daily 11)  Tylenol Pm Extra Strength 500-25 Mg  Tabs (Diphenhydramine-Apap (Sleep)) .... Take 1/2 Tab By Mouth At Bedtime As Needed 12)  Nitroquick 0.4 Mg  Subl (Nitroglycerin) .... Use As Needed 13)  Colcrys 0.6 Mg Tabs (Colchicine) .... Every Other Day 14)  Lomotil 2.5-0.025 Mg Tabs (Diphenoxylate-Atropine) .... Take By Mouth As Needed 15)  Siadh .Marland KitchenMarland Kitchen. 1500cc/day Keep Sodium in Range  Allergies: 1)  ! Ampicillin 2)  ! Penicillin 3)  ! Tetracycline 4)  ! * Protonix 5)  ! * Nexium 6)  ! * Corn Mold 7)  ! * Avelox  Past History:  Past medical, surgical, family and social histories (including risk factors) reviewed, and no changes noted (except as noted below).  Past Medical History: Reviewed history from 02/21/2009 and no changes required. Current Problems:  Prinzmetal's angina  Cath in 2000, no significant CAD Renal artery stenosis Hx. 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  Social History: Reviewed history and no changes required. No tobacco No EtOH  Review of Systems       All systems reviewed.  Negatvie to the above problm except as noted above.  Vital Signs:  Patient profile:   77 year old female  Height:      63 inches Weight:      146 pounds BMI:     25.96 Pulse rate:   85 / minute BP sitting:   140 / 74  (left arm) Cuff size:   regular  Vitals Entered By: Oswald Hillock (Sep 08, 2009 3:45 PM)  Physical Exam  Additional Exam:  Patient is in NAD HEENT:  Normocephalic, atraumatic. EOMI, PERRLA.  Neck: JVP is normal. No thyromegaly. No bruits.  Lungs: clear to auscultation. No rales no wheezes.  Heart: Regular rate and rhythm. Normal S1, S2. No S3.   No significant murmurs. PMI not displaced.   Abdomen:  Supple, nontender. Normal bowel sounds. No masses. No hepatomegaly.  Extremities:   Good distal pulses throughout. No lower extremity edema.  Musculoskeletal :moving all extremities.  Neuro:   alert and oriented x3.    Impression & Recommendations:  Problem # 1:  PRINZMETAL ANGINA (ICD-413.1) No complaints of chest pains.  COntinue current meds.  Problem # 2:  HYPONATREMIA (ICD-276.1) Followed by Dr. Abel Presto  Problem # 3:  BEN HTN HEART DISEASE WITHOUT HEART FAIL (ICD-402.10) Patient with RAS>  BP is good.  FOllow.

## 2010-06-09 NOTE — Assessment & Plan Note (Signed)
Summary: acute sick visit for bronchiectasis   Primary Provider/Referring Provider:  Deboraha Sprang at [facility]  CC:  Acute Visit.  c/o deep cough-prod with clear mucus, increased SOB with activity, and and some wheezing since Friday.  denies fever and chest tightness.  [name]  History of Present Illness: The pt comes in today for an acute sick visit.  She has known bronchiectasis, and also hyponatremia whenever she develops acute infection?.  She comes in today with increased cough and congestion, with yellow mucus production.  She is also is more sob than usual.  No fever, chills, or sweats.  The daughter is also concerned because her behavior has become abnormal, and this usually signifies dropping sodium levels.  Current Medications (verified): 1)  Pepcid 40 Mg  Tabs (Famotidine) .... Take 1 Tablet By Mouth Once A Day 2)  Folic Acid 1 Mg  Tabs (Folic Acid) .... Take 1 Tablet By Mouth Once A Day 3)  Allopurinol 300 Mg  Tabs (Allopurinol) .... Take 1 Tab By Mouth Every Other Day 4)  Plavix 75 Mg  Tabs (Clopidogrel Bisulfate) .... Take 1 Tablet By Mouth Once A Day 5)  Optivar 0.05 %  Soln (Azelastine Hcl) .... Instill 1 Drop in Each Eye Two Times A Day 6)  Ocuvite   Tabs (Multiple Vitamins-Minerals) .... Take 2 Tabs By Mouth Once Daily 7)  Norvasc 5 Mg  Tabs (Amlodipine Besylate) .... Take 1 Tablet By Mouth Once A Day 8)  Tylenol Pm Extra Strength 500-25 Mg  Tabs (Diphenhydramine-Apap (Sleep)) .... Take 1/2 Tab By Mouth At Bedtime As Needed 9)  Advair Diskus 500-50 Mcg/dose  Misc (Fluticasone-Salmeterol) .... Inhale 1 Puff Two Times A Day 10)  Nitroquick 0.4 Mg  Subl (Nitroglycerin) .... Use As Needed 11)  Isosorbide Mononitrate Cr 30 Mg Xr24h-Tab (Isosorbide Mononitrate) .... Take 1 Tablet By Mouth Once A Day 12)  Geritol Complete  Tabs (Iron-Vitamins) .... Take 1 Tablet By Mouth Two Times A Day 13)  Colcrys 0.6 Mg Tabs (Colchicine) .... Every Other Day 14)  Lomotil 2.5-0.025 Mg Tabs  (Diphenoxylate-Atropine) .... Take By Mouth As Needed  Allergies (verified): 1)  ! Ampicillin 2)  ! Penicillin 3)  ! Tetracycline 4)  ! * Protonix 5)  ! * Nexium 6)  ! * Corn Mold 7)  ! * Avelox  Review of Systems       The patient complains of shortness of breath with activity, shortness of breath at rest, productive cough, non-productive cough, and change in color of mucus.  The patient denies coughing up blood, chest pain, irregular heartbeats, acid heartburn, indigestion, loss of appetite, weight change, abdominal pain, difficulty swallowing, sore throat, tooth/dental problems, headaches, nasal congestion/difficulty breathing through nose, sneezing, itching, ear ache, anxiety, depression, hand/feet swelling, joint stiffness or pain, rash, and fever.    Vital Signs:  Patient profile:   77 year old female Height:      63 inches Weight:      148.25 pounds O2 Sat:      96 % on Room air Temp:     98.0 degrees F oral Pulse rate:   85 / minute BP sitting:   128 / 72  (left arm) Cuff size:   regular  Vitals Entered By: Gweneth Dimitri RN (June 17, 2009 12:01 PM)  O2 Flow:  Room air CC: Acute Visit.  c/o deep cough-prod with clear mucus, increased SOB with activity, and some wheezing since Friday.  denies fever and chest tightness.   Comments  Medications reviewed with patient Daytime contact number verified with patient. Gweneth Dimitri RN  June 17, 2009 12:01 PM    Physical Exam  General:  frail female in nad Nose:  no purulence noted Lungs:  minimal basilar crackles, no wheezing or rhonchi Heart:  rrr Extremities:  no significant edema or cyanosis Neurologic:  alert and oriented, but does seem confused at times during visit.   Impression & Recommendations:  Problem # 1:  BRONCHIECTASIS WITH ACUTE EXACERBATION (ICD-494.1) the pt appears to be having a bronchiectasis flare, with increased congestion and purulence.  She will need to be treated with a course of  antibiotics given her frailty and rapid decompensation in the past.  She has responded well to levaquin before without complications  Problem # 2:  HYPONATREMIA (ICD-276.1) she has a h/o siadh, ?related to her pulmonary infections.  The daughter feels that her behavior has been a little more erratic, and therefore will check her current levels.  Medications Added to Medication List This Visit: 1)  Colcrys 0.6 Mg Tabs (Colchicine) .... Every other day 2)  Levaquin 750 Mg Tabs (Levofloxacin) .... One tablet by mouth daily  Other Orders: Prescription Created Electronically [id number]) Est. Patient Level IV (84132) TLB-BMP (Basic Metabolic Panel-BMET) (80048-METABOL)  Patient Instructions: 1)  will treat with levaquin 750mg  one each day for 7days. 2)  mucinex dm extra strenth one in am and pm until better 3)  will check sodium level today. 4)  cancel followup apptm in may, and reschedule for 6mos from today.  will send in advair prescription for you. Prescriptions: ADVAIR DISKUS 500-50 MCG/DOSE  MISC (FLUTICASONE-SALMETEROL) Inhale 1 puff two times a day  #60 Each x 6   Entered and Authorized by:   Barbaraann Share MD   Signed by:   Barbaraann Share MD on 06/17/2009   Method used:   Electronically to        [facility]  [address] # [id number]* (retail)       [address]       [place], [place]  [address]       Ph: [phone] or [phone]       Fax: [phone]   RxID:   [id number] LEVAQUIN 750 MG  TABS (LEVOFLOXACIN) One tablet by mouth daily  #7 x 0   Entered and Authorized by:   Barbaraann Share MD   Signed by:   Barbaraann Share MD on 06/17/2009   Method used:   Electronically to        [facility]  [address] # [id number]* (retail)       [address]       [place], [place]  [address]       Ph: [phone] or [phone]       Fax: [phone]   RxID:   [id number]    Immunization History:  Influenza Immunization History:    Influenza:   historical (01/08/2009)  Pneumovax Immunization History:    Pneumovax:  historical (02/07/2006)

## 2010-06-09 NOTE — Progress Notes (Signed)
Summary: Question about taking Plavix   Phone Note Call from Patient Call back at Home Phone [phone]   Caller: Daughter/Jennie Summary of Call: Pt fell yesterday and had to go to Urgent care and want to know if the pt need to take plavix Initial call taken by: Judie Grieve,  December 18, 2009 4:25 PM  Follow-up for Phone Call        [facility] for call back. Follow-up by: Suzan Garibaldi RN  Additional Follow-up for Phone Call Additional follow up Details #1::        Spoke with patient's daughter....she tells me that her Mom fell a few days ago and was taken to Urgent Care and had no fractures. Her Plavix has been held for 2 days because of all of the multiple bruises. She states that her platlet count was 133, Hgb. 11.8, and Hct 35.1. Sodium was low at 125 and the kidney doctors are taking care of that. Her question was if her Mom should stay off the Plavix and for how long. Discussed with Dr.Ross...she advised that the Plavix can be held for another 3 days and to call Dr.Willis (neuro) for further advise because he started the medication in 2003 for TIA'S. Additional Follow-up by: Suzan Garibaldi RN

## 2010-06-09 NOTE — Consult Note (Signed)
Summary: [facility]  [facility]   Imported By: Marylou Mccoy 05/20/2009 16:21:56  _____________________________________________________________________  External Attachment:    Type:   Image     Comment:   External Document

## 2010-06-09 NOTE — Letter (Signed)
Summary: [facility] Office Note  [facility] Office Note   Imported By: Roderic Ovens 08/15/2009 16:13:46  _____________________________________________________________________  External Attachment:    Type:   Image     Comment:   External Document

## 2010-06-11 NOTE — Progress Notes (Signed)
Summary: refill mes   Phone Note Refill Request Call back at Home Phone [phone] Message from:  Patient on June 05, 2010 9:55 AM  Refills Requested: Medication #1:  PLAVIX 75 MG  TABS Take 1 tablet by mouth once a day  Medication #2:  PEPCID 40 MG  TABS Take 1 tablet by mouth once a day [facility] [address]    Method Requested: Fax to Local Pharmacy Initial call taken by: Lorne Skeens,  June 05, 2010 9:55 AM    Prescriptions: PEPCID 40 MG  TABS (FAMOTIDINE) Take 1 tablet by mouth once a day  #30 Tablet x 2   Entered by:   Burnett Kanaris, CNA   Authorized by:   Sherrill Raring, MD, [facility]   Signed by:   Burnett Kanaris, CNA on 06/05/2010   Method used:   Electronically to        [facility]  [address] [id number]* (retail)       [address]       [place], [place]  [address]       Ph: [phone] or [phone]       Fax: [phone]   RxID:   [id number] PLAVIX 75 MG  TABS (CLOPIDOGREL BISULFATE) Take 1 tablet by mouth once a day  #30 x 6   Entered by:   Burnett Kanaris, CNA   Authorized by:   Sherrill Raring, MD, [facility]   Signed by:   Burnett Kanaris, CNA on 06/05/2010   Method used:   Electronically to        [id number]* (retail)       [address]       [place], [place]  [address]       Ph: [phone] or [phone]       Fax: [phone]   RxID:   [id number]

## 2010-06-11 NOTE — Letter (Signed)
Summary: [facility] Patient Note   [facility] Patient Note   Imported By: Roderic Ovens 04/28/2010 12:32:31  _____________________________________________________________________  External Attachment:    Type:   Image     Comment:   External Document

## 2010-06-18 ENCOUNTER — Other Ambulatory Visit: Payer: Medicare Other

## 2010-06-18 ENCOUNTER — Ambulatory Visit (INDEPENDENT_AMBULATORY_CARE_PROVIDER_SITE_OTHER): Payer: Medicare Other | Admitting: Adult Health

## 2010-06-18 ENCOUNTER — Other Ambulatory Visit: Payer: Self-pay | Admitting: Adult Health

## 2010-06-18 ENCOUNTER — Encounter: Payer: Self-pay | Admitting: Adult Health

## 2010-06-18 ENCOUNTER — Encounter (INDEPENDENT_AMBULATORY_CARE_PROVIDER_SITE_OTHER): Payer: Self-pay | Admitting: *Deleted

## 2010-06-18 DIAGNOSIS — J479 Bronchiectasis, uncomplicated: Secondary | ICD-10-CM

## 2010-06-18 DIAGNOSIS — E871 Hypo-osmolality and hyponatremia: Secondary | ICD-10-CM

## 2010-06-18 LAB — BASIC METABOLIC PANEL
BUN: 34 mg/dL — ABNORMAL HIGH (ref 6–23)
Calcium: 8.6 mg/dL (ref 8.4–10.5)
Chloride: 99 mEq/L (ref 96–112)
Creatinine, Ser: 1.2 mg/dL (ref 0.4–1.2)

## 2010-06-19 ENCOUNTER — Telehealth: Payer: Self-pay | Admitting: Adult Health

## 2010-06-25 NOTE — Assessment & Plan Note (Signed)
Summary: Acute NP office visit - cough   Copy to:  [place] Primary Provider/Referring Provider:  Deboraha Sprang at [facility]  CC:  prod cough wit hclear foamy mucus, wheezing, and increased SOB x1week - denies f/c/s.  History of Present Illness: Pt with known hx of bronchiectasis.   June 18, 2010--Presents for an acute office visit. Complains of prod cough wit hclear foamy mucus, wheezing, increased SOB x1week. Cough is keeping up at night . NOT using otc meds. no fever . Denies chest pain, orthopnea, hemoptysis, fever, n/v/d, edema, headache.Noconfuison.  Medications Prior to Update: 1)  Isosorbide Mononitrate Cr 30 Mg Xr24h-Tab (Isosorbide Mononitrate) .... Take 1 Tablet By Mouth Once A Day 2)  Advair Diskus 500-50 Mcg/dose  Misc (Fluticasone-Salmeterol) .... Inhale 1 Puff Two Times A Day 3)  Norvasc 5 Mg  Tabs (Amlodipine Besylate) .... Take 1 Tablet By Mouth Once A Day 4)  Plavix 75 Mg  Tabs (Clopidogrel Bisulfate) .... Take 1 Tablet By Mouth Once A Day 5)  Folic Acid 1 Mg  Tabs (Folic Acid) .... Take 1 Tablet By Mouth Once A Day 6)  Pepcid 40 Mg  Tabs (Famotidine) .... Take 1 Tablet By Mouth Once A Day 7)  Allopurinol 300 Mg  Tabs (Allopurinol) .... Take 1 Tab By Mouth Every Other Day 8)  Geritol Complete  Tabs (Iron-Vitamins) .... Take 1 Tablet By Mouth Two Times A Day 9)  Optivar 0.05 %  Soln (Azelastine Hcl) .... Instill 1 Drop in Each Eye Two Times A Day 10)  Ocuvite   Tabs (Multiple Vitamins-Minerals) .... Take 2 Tabs By Mouth Once Daily 11)  Tylenol Pm Extra Strength 500-25 Mg  Tabs (Diphenhydramine-Apap (Sleep)) .... Take 1/2 Tab By Mouth At Bedtime As Needed 12)  Nitroquick 0.4 Mg  Subl (Nitroglycerin) .... Use As Needed 13)  Colcrys 0.6 Mg Tabs (Colchicine) .... Every Other Day 14)  Lomotil 2.5-0.025 Mg Tabs (Diphenoxylate-Atropine) .... Take By Mouth As Needed 15)  Siadh .... 1300cc/day Keep Sodium in Range  Current Medications (verified): 1)  Isosorbide Mononitrate Cr 30  Mg Xr24h-Tab (Isosorbide Mononitrate) .... Take 1 Tablet By Mouth Once A Day 2)  Advair Diskus 500-50 Mcg/dose  Misc (Fluticasone-Salmeterol) .... Inhale 1 Puff Two Times A Day 3)  Norvasc 5 Mg  Tabs (Amlodipine Besylate) .... Take 1 Tablet By Mouth Once A Day 4)  Plavix 75 Mg  Tabs (Clopidogrel Bisulfate) .... Take 1 Tablet By Mouth Once A Day 5)  Folic Acid 1 Mg  Tabs (Folic Acid) .... Take 1 Tablet By Mouth Once A Day 6)  Pepcid 40 Mg  Tabs (Famotidine) .... Take 1 Tablet By Mouth Once A Day 7)  Allopurinol 300 Mg  Tabs (Allopurinol) .... Take 1 Tab By Mouth Every Other Day 8)  Geritol Tonic  Liqd (Iron-Vitamins) .Marland Kitchen.. 1 Tablespoon By Mouth Two Times A Day 9)  Optivar 0.05 %  Soln (Azelastine Hcl) .... Instill 1 Drop in Each Eye Two Times A Day 10)  Ocuvite   Tabs (Multiple Vitamins-Minerals) .... Take 2 Tabs By Mouth Once Daily 11)  Tylenol Pm Extra Strength 500-25 Mg  Tabs (Diphenhydramine-Apap (Sleep)) .... Take 1/2 Tab By Mouth At Bedtime As Needed 12)  Nitroquick 0.4 Mg  Subl (Nitroglycerin) .... Use As Needed 13)  Colcrys 0.6 Mg Tabs (Colchicine) .... Every Other Day 14)  Lomotil 2.5-0.025 Mg Tabs (Diphenoxylate-Atropine) .... Take By Mouth As Needed 15)  Siadh .Marland KitchenMarland Kitchen. 1500cc/day Keep Sodium in Range  Allergies (verified): 1)  ! Ampicillin  2)  ! Penicillin 3)  ! Tetracycline 4)  ! * Protonix 5)  ! * Nexium 6)  ! * Corn Mold 7)  ! * Avelox  Past History:  Past Medical History: Last updated: 02/21/2009 Current Problems:  Prinzmetal's angina  Cath in 2000, no significant CAD Renal artery stenosis Hx. 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) BRONCHIECTASIS WITHOUT ACUTE EXACERBATION (ICD-494.0) OBSTRUCTIVE CHRONIC BRONCHITIS WITHOUT EXACERBAT (ICD-491.20)    Social History: Last updated: 09/08/2009 No tobacco No EtOH  Risk Factors: Smoking Status: quit (06/27/2007)  Review of Systems      See HPI  Vital  Signs:  Patient profile:   77 year old female Height:      63 inches Weight:      143.13 pounds BMI:     25.45 O2 Sat:      94 % on Room air Temp:     98.1 degrees F oral Pulse rate:   91 / minute BP sitting:   124 / 72  (left arm) Cuff size:   regular  Vitals Entered By: Boone Master CNA/MA (June 18, 2010 1:58 PM)  O2 Flow:  Room air CC: prod cough wit hclear foamy mucus, wheezing, increased SOB x1week - denies f/c/s Is Patient Diabetic? No Comments Medications reviewed with patient Daytime contact number verified with patient. Boone Master CNA/MA  June 18, 2010 1:58 PM     Physical Exam  Additional Exam:  GEN: A/Ox3; pleasant , NAD, elderly  HEENT:  [place]/AT, , EACs-clear, TMs-wnl, NOSE-clear, THROAT-clear NECK:  Supple w/ fair ROM; no JVD; normal carotid impulses w/o bruits; no thyromegaly or nodules palpated; no lymphadenopathy. RESP  Clear to P & A; w/o, wheezes/ rales/ or rhonchi. CARD:  RRR, no m/r/g   GI:   Soft & nt; nml bowel sounds; no organomegaly or masses detected. [place]: Warm bil,  no calf tenderness edema, clubbing, pulses intact Neuro: intact w. no focal deficits    Impression & Recommendations:  Problem # 1:  BRONCHIECTASIS WITH ACUTE EXACERBATION (ICD-494.1) Mild flare :  Cipro 500mg  two times a day for 7days Mucinex DM two times a day as needed cough/congestion REst.  I will call with labs.  follow up Dr. Shelle Iron 2 months  Please contact office for sooner follow up if symptoms do not improve or worsen   Problem # 2:  HYPONATREMIA (ICD-276.1) bmet pending Orders: TLB-BMP (Basic Metabolic Panel-BMET) (80048-METABOL) Est. Patient Level IV (56213)  Medications Added to Medication List This Visit: 1)  Geritol Tonic Liqd (Iron-vitamins) .Marland Kitchen.. 1 tablespoon by mouth two times a day 2)  Siadh  .Marland KitchenMarland Kitchen. 1500cc/day keep sodium in range 3)  Cipro 500 Mg Tabs (Ciprofloxacin hcl) .Marland Kitchen.. 1 by mouth two times a day  Patient Instructions: 1)  Cipro 500mg   two times a day for 7days 2)  Mucinex DM two times a day as needed cough/congestion 3)  REst.  4)  I will call with labs.  5)  follow up Dr. Shelle Iron 2 months  6)  Please contact office for sooner follow up if symptoms do not improve or worsen  Prescriptions: CIPRO 500 MG TABS (CIPROFLOXACIN HCL) 1 by mouth two times a day  #14 x 0   Entered and Authorized by:   Rubye Oaks NP   Signed by:   Aswad Wandrey NP on 06/18/2010   Method used:   Electronically to        [facility] #  [id number]* (retail)       [address]       [place], [place]  [address]       Ph: [phone] or [phone]       Fax: [phone]   RxID:   [id number]

## 2010-06-25 NOTE — Progress Notes (Signed)
Summary: Test results  Phone Note Call from Patient Call back at Home Phone [phone]   Caller: Daughter Reason for Call: Lab or Test Results Summary of Call: Patient's daughter returned Shanda Bumps Jone's call.  Please call back and leave message. Initial call taken by: Leonette Monarch,  June 19, 2010 11:05 AM  Follow-up for Phone Call        Spoke wiht pt daughter Arvilla Meres and she was advised of results and recs per append to BMP. Carron Curie CMA  June 19, 2010 12:46 PM

## 2010-08-13 LAB — BASIC METABOLIC PANEL
BUN: 26 mg/dL — ABNORMAL HIGH (ref 6–23)
CO2: 21 mEq/L (ref 19–32)
CO2: 24 mEq/L (ref 19–32)
Calcium: 8.4 mg/dL (ref 8.4–10.5)
Calcium: 8.5 mg/dL (ref 8.4–10.5)
Chloride: 95 mEq/L — ABNORMAL LOW (ref 96–112)
Creatinine, Ser: 1.1 mg/dL (ref 0.4–1.2)
GFR calc Af Amer: 51 mL/min — ABNORMAL LOW (ref >60)
GFR calc Af Amer: 52 mL/min — ABNORMAL LOW (ref >60)
GFR calc non Af Amer: 42 mL/min — ABNORMAL LOW (ref >60)
GFR calc non Af Amer: 46 mL/min — ABNORMAL LOW (ref >60)
Glucose, Bld: 176 mg/dL — ABNORMAL HIGH (ref 70–99)
Potassium: 2.8 mEq/L — ABNORMAL LOW (ref 3.5–5.1)
Potassium: 4.1 mEq/L (ref 3.5–5.1)
Sodium: 121 mEq/L — ABNORMAL LOW (ref 135–145)
Sodium: 123 mEq/L — ABNORMAL LOW (ref 135–145)
Sodium: 123 mEq/L — ABNORMAL LOW (ref 135–145)

## 2010-08-13 LAB — COMPREHENSIVE METABOLIC PANEL
Albumin: 3.1 g/dL — ABNORMAL LOW (ref 3.5–5.2)
BUN: 21 mg/dL (ref 6–23)
Creatinine, Ser: 1.01 mg/dL (ref 0.4–1.2)
Total Bilirubin: 0.8 mg/dL (ref 0.3–1.2)
Total Protein: 6 g/dL (ref 6.0–8.3)

## 2010-08-13 LAB — CBC
HCT: 41.7 % (ref 36.0–46.0)
HCT: 41.8 % (ref 36.0–46.0)
Hemoglobin: 14.1 g/dL (ref 12.0–15.0)
MCHC: 33.8 g/dL (ref 30.0–36.0)
MCV: 94 fL (ref 78.0–100.0)
MCV: 94.4 fL (ref 78.0–100.0)
Platelets: 224 10*3/uL (ref 150–400)
Platelets: 243 10*3/uL (ref 150–400)
RBC: 4.44 MIL/uL (ref 3.87–5.11)
RDW: 14.8 % (ref 11.5–15.5)
RDW: 14.9 % (ref 11.5–15.5)
WBC: 10.3 K/uL (ref 4.0–10.5)

## 2010-08-13 LAB — OSMOLALITY, URINE: Osmolality, Ur: 356 mosm/kg — ABNORMAL LOW (ref 390–1090)

## 2010-08-13 LAB — CREATININE, URINE, RANDOM: Creatinine, Urine: 43.8 mg/dL

## 2010-08-13 LAB — URINALYSIS, ROUTINE W REFLEX MICROSCOPIC
Bilirubin Urine: NEGATIVE
Glucose, UA: NEGATIVE mg/dL
Ketones, ur: NEGATIVE mg/dL
Nitrite: NEGATIVE
Protein, ur: 100 mg/dL — AB
Specific Gravity, Urine: 1.013 (ref 1.005–1.030)
Urobilinogen, UA: 0.2 mg/dL (ref 0.0–1.0)
pH: 7 (ref 5.0–8.0)

## 2010-08-13 LAB — URINE MICROSCOPIC-ADD ON

## 2010-08-13 LAB — BASIC METABOLIC PANEL WITH GFR
Chloride: 88 meq/L — ABNORMAL LOW (ref 96–112)
GFR calc Af Amer: 56 mL/min — ABNORMAL LOW (ref >60)
Glucose, Bld: 113 mg/dL — ABNORMAL HIGH (ref 70–99)

## 2010-08-13 LAB — DIFFERENTIAL
Basophils Absolute: 0 K/uL (ref 0.0–0.1)
Basophils Relative: 0 % (ref 0–1)
Eosinophils Absolute: 0 10*3/uL (ref 0.0–0.7)
Eosinophils Relative: 0 % (ref 0–5)
Lymphocytes Relative: 7 % — ABNORMAL LOW (ref 12–46)
Lymphs Abs: 0.7 10*3/uL (ref 0.7–4.0)
Monocytes Absolute: 1.3 K/uL — ABNORMAL HIGH (ref 0.1–1.0)
Monocytes Relative: 13 % — ABNORMAL HIGH (ref 3–12)
Neutro Abs: 8.2 K/uL — ABNORMAL HIGH (ref 1.7–7.7)
Neutrophils Relative %: 80 % — ABNORMAL HIGH (ref 43–77)

## 2010-08-13 LAB — SODIUM, URINE, RANDOM: Sodium, Ur: 55 meq/L

## 2010-08-13 LAB — URINE CULTURE
Colony Count: NO GROWTH
Culture: NO GROWTH

## 2010-09-11 ENCOUNTER — Encounter: Payer: Self-pay | Admitting: Internal Medicine

## 2010-09-14 ENCOUNTER — Ambulatory Visit (INDEPENDENT_AMBULATORY_CARE_PROVIDER_SITE_OTHER): Payer: Medicare Other | Admitting: Internal Medicine

## 2010-09-14 ENCOUNTER — Encounter: Payer: Self-pay | Admitting: Internal Medicine

## 2010-09-14 DIAGNOSIS — R5381 Other malaise: Secondary | ICD-10-CM

## 2010-09-14 DIAGNOSIS — I201 Angina pectoris with documented spasm: Secondary | ICD-10-CM

## 2010-09-14 DIAGNOSIS — R5383 Other fatigue: Secondary | ICD-10-CM

## 2010-09-14 DIAGNOSIS — I119 Hypertensive heart disease without heart failure: Secondary | ICD-10-CM

## 2010-09-14 LAB — CBC WITH DIFFERENTIAL/PLATELET
Basophils Relative: 0.5 % (ref 0.0–3.0)
Eosinophils Relative: 8.3 % — ABNORMAL HIGH (ref 0.0–5.0)
HCT: 35.6 % — ABNORMAL LOW (ref 36.0–46.0)
Hemoglobin: 12.2 g/dL (ref 12.0–15.0)
Lymphs Abs: 1 10*3/uL (ref 0.7–4.0)
Monocytes Relative: 15.7 % — ABNORMAL HIGH (ref 3.0–12.0)
Neutro Abs: 3.2 10*3/uL (ref 1.4–7.7)
RBC: 3.8 Mil/uL — ABNORMAL LOW (ref 3.87–5.11)
RDW: 16.7 % — ABNORMAL HIGH (ref 11.5–14.6)
WBC: 5.6 10*3/uL (ref 4.5–10.5)

## 2010-09-14 MED ORDER — ISOSORBIDE MONONITRATE ER 30 MG PO TB24: 30.0000 mg | ORAL_TABLET | Freq: Every day | ORAL | Status: DC

## 2010-09-14 MED ORDER — NITROGLYCERIN 0.4 MG SL SUBL: 0.4000 mg | SUBLINGUAL_TABLET | SUBLINGUAL | Status: DC | PRN

## 2010-09-14 MED ORDER — AMLODIPINE BESYLATE 5 MG PO TABS: 5.0000 mg | ORAL_TABLET | Freq: Every day | ORAL | Status: DC

## 2010-09-14 MED ORDER — CLOPIDOGREL BISULFATE 75 MG PO TABS: 75.0000 mg | ORAL_TABLET | Freq: Every day | ORAL | Status: DC

## 2010-09-14 NOTE — Progress Notes (Signed)
HPI Patient is a 77 year old with a history of Prinzmetal's angina, RA stenosis, GE reflux, hyponatremia.  I saw her last in May of 2011 Since seen she denies chest pain.  Daughter does say she is slowing some.  Not moving as quickly.  Does have problems with hallucinations.  Has been seen by neuro.  Sees colors.  Usually when sitting, not when walking.   Allergies  Allergen Reactions  . Ampicillin   . Esomeprazole Magnesium   . Moxifloxacin   . Pantoprazole Sodium   . Penicillins   . Tetracycline     Current Outpatient Prescriptions  Medication Sig Dispense Refill  . allopurinol (ZYLOPRIM) 300 MG tablet Take 300 mg by mouth every other day.       Marland Kitchen amLODipine (NORVASC) 5 MG tablet Take 5 mg by mouth daily.        Marland Kitchen azelastine (OPTIVAR) 0.05 % ophthalmic solution 1 drop 2 (two) times daily.        . clopidogrel (PLAVIX) 75 MG tablet Take 75 mg by mouth daily.        . colchicine 0.6 MG tablet Take 0.6 mg by mouth every other day.        . diphenoxylate-atropine (LOMOTIL) 2.5-0.025 MG per tablet Take 1 tablet by mouth 4 (four) times daily as needed.        . famotidine (PEPCID) 40 MG tablet Take 40 mg by mouth daily.        . Fluticasone-Salmeterol (ADVAIR DISKUS) 500-50 MCG/DOSE AEPB Inhale 1 puff into the lungs every 12 (twelve) hours.        . folic acid (FOLVITE) 1 MG tablet Take 1 mg by mouth daily.        . isosorbide mononitrate (IMDUR) 30 MG 24 hr tablet Take 30 mg by mouth daily.        . Multiple Vitamins-Minerals (OCUVITE ADULT 50+ PO) Take 1 tablet by mouth 2 (two) times daily.        . nitroGLYCERIN (NITROSTAT) 0.4 MG SL tablet Place 0.4 mg under the tongue every 5 (five) minutes as needed.        Marland Kitchen DISCONTD: ciprofloxacin (CIPRO) 500 MG tablet Take 500 mg by mouth 2 (two) times daily.          Past Medical History  Diagnosis Date  . Prinzmetal angina   . RAS (renal artery stenosis)   . Hyponatremia     secondary to  SIADH  . GERD (gastroesophageal reflux disease)   .  Bronchiectasis without acute exacerbation   . Obstructive chronic bronchitis without exacerbation     No past surgical history on file.  No family history on file.  History   Social History  . Marital Status: Widowed    Spouse Name: N/A    Number of Children: N/A  . Years of Education: N/A   Occupational History  . Not on file.   Social History Main Topics  . Smoking status: Never Smoker   . Smokeless tobacco: Never Used  . Alcohol Use: No  . Drug Use: No  . Sexually Active: Not on file   Other Topics Concern  . Not on file   Social History Narrative  . No narrative on file    Review of Systems:  All systems reviewed.  They are negative to the above problem except as previously stated.  Vital Signs: BP 134/68  Pulse 80  Ht 5\' 2"  (1.575 m)  Wt 140 lb (63.504 kg)  BMI 25.61 kg/m2  Physical Exam Patient is in NAD  HEENT:  Normocephalic, atraumatic. EOMI, PERRLA.  Neck: JVP is normal. No thyromegaly.  L bruit. Lungs: clear to auscultation. No rales no wheezes.  Heart: Regular rate and rhythm. Normal S1, S2. No S3.   No significant murmurs. PMI not displaced.  Abdomen:  Supple, nontender. Normal bowel sounds. No masses. No hepatomegaly.  Extremities:   Good distal pulses throughout. No lower extremity edema.  Musculoskeletal :moving all extremities.  Neuro:   alert and oriented x3.  CN II-XII grossly intact.  EKG:  NSR.  80 bpm.  RBBB.  Nonspecific ST T wave changes   Assessment and Plan:

## 2010-09-14 NOTE — Patient Instructions (Signed)
Lab work today. We will call you with results.  Your physician wants you to follow-up in:12 months with Dr.Ross You will receive a reminder letter in the mail two months in advance. If you don't receive a letter, please call our office to schedule the follow-up appointment.  

## 2010-09-15 NOTE — Assessment & Plan Note (Signed)
BP is good.  Family wonders if her bp is a little low ant that this is explaining her hallucinations.  This does not seem to be right though as symptoms are better with walking. I don't think it will hurt her to cut back on meds.  See if symptms improve.  If though her BP is in the 150s or higher then she should go back to current dose.

## 2010-09-15 NOTE — Assessment & Plan Note (Addendum)
No symptoms of angina.  I don't think her symptoms of slowing down that the family notes are angina equivalents.  I will check CBC and TSH.   No changes otherwise.Marland Kitchen

## 2010-09-21 ENCOUNTER — Other Ambulatory Visit: Payer: Self-pay | Admitting: Internal Medicine

## 2010-09-22 NOTE — Assessment & Plan Note (Signed)
[facility]                            CARDIOLOGY OFFICE NOTE   Jasmine, Morton                   MRN:          [id number]  DATE:03/25/2008                            DOB:          May 07, 1915    IDENTIFICATION:  [name] is a 77 year old woman who I saw back in  May 2009.  She has a history of Prinzmetal angina and also history of  hyponatremia.  Note, she has been hospitalized last time again in May  2009.   Since seen, she has been doing fairly well.  She notes some shortness of  breath with activity.  Denies any significant chest pressure.  I have  been working out on her medicines with her gout.   Her current medicines include folic acid, allopurinol 300 every other  day, Plavix 75, Optivar eye drops, Ocuvite eye drops, multivitamin,  amlodipine 5, Advair 500/50 1 puff b.i.d., isosorbide 30, Geritol  b.i.d., and colchicine 0.6 b.i.d. and also Pepcid 40.   PHYSICAL EXAMINATION:  GENERAL:  On exam, the patient is in no distress.  VITAL SIGNS:  Her blood pressure is 123/73, pulse is 87 and regular, and  weight 140 which is stable.  NECK:  No JVD.  LUNGS:  Clear.  CARDIAC:  Regular rate and rhythm.  S1 and S2.  No S3.  No significant  murmurs  ABDOMEN:  Benign.  EXTREMITIES:  No edema.   IMPRESSION:  1. History of Prinzmetal angina.  I am not convinced with her symptoms      of shortness of breath, however, it is equivalent to angina.  She      has some fatigue.  Note, she had a Myoview done in March 2009 which      was normal.  We will check her electrolytes for now and follow up.      Continue on current regimen.  Note, she has been on aspirin and      Plavix since her hospitalization in 2000.  She feels better on      this.  I would keep her on this.  2. History of renal artery stenosis, moderate.  Blood pressure      adequate.  3. History of hyponatremia.  Again, we will repeat.  4. Gastroesophageal reflux.  The patient can  increase her Pepcid to      b.i.d. as needed.   I will set to see the patient back in the spring or sooner if problems  develop.   ADDENDUM:  A 12-lead EKG normal sinus rhythm, 90 beats per minute, right  bundle-branch block, nonspecific ST changes.     Pricilla Riffle, MD, [facility]  Electronically Signed    PVR/MedQ  DD: 03/25/2008  DT: 03/26/2008  Job #: [id number]

## 2010-09-22 NOTE — H&P (Signed)
[name], [name]            ACCOUNT NO.:  [id number]   MEDICAL RECORD NO.:  [id number]          PATIENT TYPE:  INP   LOCATION:  1405                         FACILITY:  [facility]   PHYSICIAN:  Donalynn Furlong, MD      DATE OF BIRTH:  [birth year]   DATE OF ADMISSION:  09/22/2007  DATE OF DISCHARGE:                              HISTORY & PHYSICAL   PRIMARY CARE Trigg Delarocha:  [facility] [place]. Name of PCP  is not known to the patient at this time.   CHIEF COMPLAINT:  Pneumonia.   HISTORY OF PRESENTING ILLNESS:  [name] is a 77 year old female.  She lives with her daughter.  She is being seen by [facility]  at [place].  She was here in the hospital about a week ago for GI  bleed and was discharged home.  Subsequently, she was doing fine for a  few days at home.  Then, she developed cough with yellow sputum, chest  pain over the center of the chest and on the right side of the chest.  She also had worsening fatigue and shortness of breath at rest and on  exertion along with the above symptoms.  Her chest pain is sharp,  stabbing, located over the center and right side of the chest. It gets  worse with cough, deep breaths and movement of the chest.  She denied  any history of coronary artery disease except Prinzmetal angina.  She  denied any history of clots in the lung.  She denied any sick contacts.  She denied any history of gallbladder problems.  She does have a history  of gastroesophageal reflux disease and colon cancer in the past.  She  denied any fever or chills at home, but she was feeling cold at home.  She denied any nausea, vomiting, diarrhea, abdominal pain, blood in the  stool, black tarry stools recently.  She denied any urinary complaints  also.   PAST MEDICAL HISTORY:  Includes:  1. Hyponatremia.  2. Chronic renal insufficiency.  3. GI bleed.  4. Syncopal episode.  5. Colon cancer.  6. Prinzmetal angina.  7. Anemia.  8.  Bronchiectasis.  9. Gout.  10.Gastroesophageal reflux disease.  11.TIA.  12.History of positive PPD.  13.Tonsillectomy.  14.Cataract surgery.  15.Cesarean section.   PAST SURGICAL HISTORY:  As per past medical history above.   FAMILY HISTORY:  Nothing contributory.   SOCIAL HISTORY:  The patient lives with her daughter.  She is quite  ambulatory with the help of cane and walker.  She has history of smoking  in the past.   REVIEW OF SYSTEMS:  Positive as per HPI, otherwise negative.  Review of  systems done for 14 systems.   ALLERGIES:  1. NEXIUM.  2. PROTONIX.  3. TETRACYCLINE.  4. PENICILLIN.  5. AMPICILLIN.   HOME MEDICATIONS:  Plavix, folic acid, Norvasc, allopurinol, Pepcid,  Optivar eyedrops, Allegra, Centrum Silver, loperamide, isosorbide  mononitrate, aspirin and Advair Diskus.   PHYSICAL EXAMINATION:  VITAL SIGNS:  Blood pressure 136/66, pulse 97,  respirations 20, temperature 98.4, oxygen saturation 90% on 2  liters at  the time of arrival which improved to 95% on 2 liters now.  GENERAL EXAM:  Alert, oriented x3, lying in bed not in acute distress.  HEAD:  Normocephalic nontraumatic.  EYES:  Pupils equally reactive to light and accommodation.  Extraocular  muscles intact.  ORAL CAVITY:  Oral mucosa moist, no thrush noted.  NECK:  No thyromegaly or JVD.  CARDIOVASCULAR:  S1, S2 regular.  No murmur, rub, gallop.  LUNGS:  Mild right basilar crackles, otherwise both lungs clear to  auscultation bilaterally.  ABDOMEN:  Nontender, nondistended, soft, no organomegaly noted.  EXTREMITIES:  No clubbing, cyanosis or edema.  Pulse palpable in all  four extremities.  SKIN:  No rash or bruits.  Skin is pale.  NEUROLOGICAL EXAM:  Shows intact cranial nerves, muscular strength,  sensations and reflexes.   LABORATORY:  Shows a urinalysis with no UTI.  WBC 13.3, hemoglobin 11.7,  platelet 267.  Chest x-ray with suspected right medial lung base  pneumonia.  Metabolic panel  shows sodium 131, BUN 28, creatinine 1.4,  glucose 112, otherwise normal.   ASSESSMENT AND PLAN:  1. Right-sided pneumonia with a differential of aspiration pneumonia      versus health-care-associated pneumonia due to recent hospital      admission.  2. Hyponatremia.  3. Anemia.  4. Chronic renal insufficiency.  5. Leukocytosis.  6. History of recent gastrointestinal bleed.  7. Syncopal episode.  8. History of colon cancer.  9. History of Prinzmetal angina.  10.History of gout.  11.History of gastroesophageal reflux disease.  12.History of transient ischemic attack.  13.History of tonsillectomy.   PLAN:  Will admit the patient to a telemetry bed under Dr. Virginia Rochester with the diagnosis of pneumonia and deconditioning.  The  patient chose to be DNR, so DNR form has been filled out.  The patient  will be kept n.p.o. except medications, ice chips and water at this  time.  Will get dysphagia consult in the morning.  Will check CBC, CMP,  magnesium, phosphate, EKG in the morning.  We will check cardiac enzymes  with troponin every 6 hours x3.  Will check flu swab, urine Legionella  antigen, blood culture, urine cultures, sputum culture and Gram stain at  this time.  Will start her on breathing treatment of albuterol and  ipratropium q.6h. and p.r.n.  Will put SCD on both legs to prevent any  DVTs.  We will not give her any Lovenox due to history of recent GI  bleed.  Will start Pepcid 40 mg p.o. daily for GI prophylaxis.  Will  continue amlodipine, allopurinol, folate at home doses.  Will start IV  antibiotic vancomycin 1 gram, one dose now and then we will decide  antibiotic according to the culture tomorrow.  We will also start  clindamycin 300 mg IV q.6h. for pneumonia coverage.  The patient has an  EKG done at this time also.   The patient presented with partially-treated pneumonia by Avelox started  yesterday by cardiologist. She received 2 dose of Avelox before coming   to ER The patient started having some twitching.  Avelox may cause  tremor.  I would not put Avelox in the allergy list because it just  caused tremor and it is not completely proven that Avelox caused tremor  in this case.  She has a history of allergy to PENICILLIN with some rash  in the past.  The patient's allergy was discussed with the patient and  she  agrees that she does not have allergy to Avelox.  Further plan  according to the workup pending.      Donalynn Furlong, MD  Electronically Signed     TVP/MEDQ  D:  09/23/2007  T:  09/23/2007  Job:  [id number]   cc:   Hollice Espy, M.D.

## 2010-09-22 NOTE — Assessment & Plan Note (Signed)
[facility]                            CARDIOLOGY OFFICE NOTE   Jasmine, Morton                   MRN:          [id number]  DATE:09/21/2007                            DOB:          10-Aug-1914    IDENTIFICATION:  [name] is a 77 year old woman who I saw back in  February.  She has a history of hyponatremia and Prinzmetal angina.   The patient was just discharged from the hospital for evaluation of a GI  bleed.  She was discharged on May 6.  She had rectal bleeding and  syncope.  Note, she just had an EGD and colonoscopy with polypectomy on  April 23.  Plavix was held 5 days prior.  She continued on her aspirin.  She had bloody bowel movements on May 2 and came to the emergency room.   The patient was observed and discharged home.   She did well for the first couple days, but the patient's daughter says  then she started developing a cough and pain in her chest (she was  resuscitated from the syncope.  There was a question if she had had CPR,  but it is unclear).  She notes chills, but she is always chilled, maybe  a little more.  Cough is productive of yellowish sputum.   CURRENT MEDICINES:  Include:  1. Pepcid 40 daily.  2. Folic acid.  3. Allopurinol 300.  4. Plavix on hold.  5. Optivar eye drops.  6. Aspirin 81.  7. Ocuvite.  8. Multivitamin.  9. Amlodipine 5.  10.Advair.  11.Isorbid 30.   PHYSICAL EXAMINATION:  The patient is in no distress.  Blood pressure is  148/64, pulse is 100, weight 140.  Her lungs show decreased breath sounds at the right base, some rhonchi  noted.  CARDIAC EXAM:  Regular rate and rhythm, S1-S2, no S3, no significant  murmurs.  ABDOMEN:  Is benign.  EXTREMITIES:  No edema.   A 12-lead EKG shows sinus rhythm at 99, right bundle branch block, right  atrial abnormality old.  Septal infarct.   Chest x-ray is suspicious for actually a right lower lobe infiltrate.  There is a vessel seen on end but  then some fuzziness along the right  heart border, again also seen in the lateral views.   IMPRESSION:  1. Cough, shortness of breath, fatigue.  I think she may indeed have a      pneumonia.  I will send the x-ray for official review, but I have      her given a prescription for Avelox 400 daily for 10 days.  I have      discussed this with [name]. I told the daughter and the      patient if her symptoms in any way worsen she needs to go the      hospital, but we will try outpatient for now.  Will call her      tomorrow.  Will need to make sure that she has follow-up.  2. History of Prinzmetal angina.  Seems to be stable.  3. Gastrointestinal.  Again, will have follow-up  in GI.  I will check      a CBC today as well as urinalysis.   Follow-up will be planned.  I will tentatively placed her for the fall,  but again I will be in touch with her regarding how she is responding to  the antibiotics.     Jasmine Riffle, MD, [facility]  Electronically Signed    PVR/MedQ  DD: 09/21/2007  DT: 09/21/2007  Job #: [id number]

## 2010-09-22 NOTE — Discharge Summary (Signed)
NAMESEMA, STANGLER            ACCOUNT NO.:  [id number]   MEDICAL RECORD NO.:  [id number]          PATIENT TYPE:  INP   LOCATION:  2626                         FACILITY:  [facility]   PHYSICIAN:  John C. Madilyn Fireman, M.D.    DATE OF BIRTH:  [birth year]   DATE OF ADMISSION:  09/09/2007  DATE OF DISCHARGE:  09/13/2007                               DISCHARGE SUMMARY   Ms. Braunschweig was admitted to [facility] on Sep 09, 2007, she was  discharged on Sep 13, 2007.  Her admit diagnoses included rectal bleeding  and syncope.  The rest of her admit diagnoses are reflective of her past  medical history and include colon cancer, Prinzmetal angina, anemia,  bronchiectasis, Mycobacterium avium  intracellulare airway colonization,  gout, GERD, TIAs for what she is on aspirin and Plavix, hyponatremia a  positive PPD test and a history of MRSA.   DISCHARGE DIAGNOSIS:  Postpolypectomy bleed on Plavix and aspirin.   CONSULTATIONS:  Dr. Coralyn Helling of critical care was consulted on Sep 09, 2007.  There were no procedures.   BRIEF HISTORY AND PHYSICAL AND HOSPITAL COURSE:  This is a 77 year old  female who had an upper endoscopy and colonoscopy with polypectomy on  August 31, 2007.  She had stopped taking her Plavix 5 days prior to her  colonoscopy procedure, but kept taking her 81-mg aspirin.  She restarted  her Plavix approximately April 30.  She developed bloody bowel movements  on Sep 09, 2007, and was advised to come to [facility] ER.  While in the  emergency room, she had a syncopal episode.  She was resuscitated and  admitted to the ICU.   LABORATORY DATA:  Lab work including cardiac enzymes were checked.  The  patient was given blood and fluid and closely monitored in the ICU for  approximately 36 hours.  She was then transferred to the step-down unit  and quickly improved.  Her stools changed from red to black to brown.  Her hemoglobin stabilized and diet was slowly advanced.  On the day of  her discharge, the patient was able to ambulate around the room with  ease and tolerate a full diet.  She was given one final unit of blood  and her hemoglobin at discharge was 10.9.  Her hematocrit was 32.1.  The  patient was discharged to home in good condition.   DISCHARGE INSTRUCTIONS:  Eating a low-residue diet and increasing  activity slowly.  She was told to hold her aspirin and Plavix until  after her followup appointment with Dr. Evette Cristal scheduled for Sep 18, 2007, at 11 a.m.   DISCHARGE MEDICATIONS:  1. Imdur.  2. Pepcid 40 mg.  3. Folic acid.  4. Allopurinol 300 mg.  5. Amlodipine 5 mg.  6. Advair 500/50 mg.      Stephani Police, PA    ______________________________  Everardo All. Madilyn Fireman, M.D.    MLY/MEDQ  D:  09/14/2007  T:  09/15/2007  Job:  [id number]   cc:   Graylin Shiver, M.D.  Pricilla Riffle, MD, [facility]

## 2010-09-22 NOTE — Discharge Summary (Signed)
[name], [name]            ACCOUNT NO.:  [id number]   MEDICAL RECORD NO.:  [id number]          PATIENT TYPE:  INP   LOCATION:  1405                         FACILITY:  [facility]   PHYSICIAN:  Hollice Espy, M.D.DATE OF BIRTH:  May 30, 1914   DATE OF ADMISSION:  09/23/2007  DATE OF DISCHARGE:  09/25/2007                               DISCHARGE SUMMARY   PRIMARY CARE PHYSICIAN:  She does not have a primary care doctor, but I  am recommending that she follow up with Dr. Christiana Fuchs.  Dr.  Fredrich Birks is a new doctor at [facility], the same practice  as Dr. Joselyn Arrow.   DISCHARGE DIAGNOSES:  1. Right lower lobe pneumonia.  2. Acute renal failure, now resolved.  3. History of hypertension.  4. Anemia.  5. Recent gastrointestinal bleed.  6. History of colon carcinoma.  7. History of gastroesophageal reflux disease.  8. Constipation.   DISCHARGE MEDICATIONS:  New medications:  1. Ceftin 500 mg p.o. q.12h. x5 days.  2. Zithromax 500 mg p.o. daily.  3. Albuterol M.D.I. inhaler two puffs four times a day p.r.n.  Old medications:  1. Folic acid 1 mg p.o. daily.  2. Norvasc 5 p.o. daily.  3. Allopurinol 300 mg p.o. every other day.  4. Pepcid 40 mg p.o. b.i.d.  5. Optivar eye drops, one drop both eyes b.i.d.  6. Allegra 180 p.r.n.  7. Multivitamin daily.  8. Imodium p.r.n.  9. Imdur 30 p.o. daily.  10.Advair 500/50 one puff b.i.d.  11.P.R.N. nitroglycerin.  (The patient was scheduled to restart aspirin and Plavix.  These  medications will remain on hold until she follows up with Dr. Evette Cristal.)   DISCHARGE DIET:  A low-sodium diet.   ACTIVITY:  Slow to increase.   HOSPITAL COURSE:  The patient is a 77 year old white female with a past  medical history of recent GI bleed, as well as hypertension and CAD, who  was just discharged from the hospital for her GI bleed a week ago.  At  that time, she had developed a cough, some yellowish sputum and  shortness of  breath when she followed up with Dr. Tenny Craw.  There was a  concern about pneumonia.  She was started on Avelox.  She received two  doses of Avelox before coming to the ER, started having some twitching  and tremor, so the patient did not want to take this medicine.  She came  to the emergency room and was found have a right medial base pneumonia.  Because there was a questionable concern for aspiration, and the patient  has already allergies listed to penicillin and tetracycline, she was  started on clindamycin and given a dose of vancomycin in the emergency  room.  She was admitted for treatment of pneumonia.  The patient was  also started on nebulizer treatments and oxygen.  On hospital day #2,  she was feeling better.  A nursing bedside swallow evaluation showed no  evidence of any aspiration, and we were able to change her antibiotics  over to p.o. We went with community-acquired treatment with Ceftin and  Zithromax.  The patient tolerated this well.  She had very poor  inspiratory effort with inspirometer, and still remained weak.  She was  continued on IV fluids, and to was starting to ambulate in the hall.  Her O2 saturations remained steady from 92-94%.  By hospital day three,  she was felt to be medically stable and okay for discharge.   The plan will be for the patient to continue on five more days of  antibiotics, as well as continue on an inspirometer.  She is advised to  increase her activity slowly.  I discussed this with her daughter, whom  she lives with.  In regard to the patient's acute renal failure, she is  mildly dehydrated with a creatinine of 1.8 and a BUN of 30.  Her initial  hemoglobin was 11.1.  However, this was more likely hemoconcentrated,  because following IV fluids her renal function resumed back to normal at  baseline, and her hemoglobin dropped from 11 down to 9.8.  I discussed  this with the daughter and explained to her that this was not an actual  true  drop in her hemoglobin because her blood pressure and heart  remained stable, and this was more from hemodilution following treatment  of IV fluids.  She understood this. The patient does not have a primary  care physician.  She formerly used to see Dr. Zollie Scale of [place], but  he has left the practice, and she has been following with Dr. Tenny Craw of  cardiology and Dr. Evette Cristal of GI for her medical issues.  I have advised  the patient, as well as spoken with her daughter, about the need to  establish a primary care physician to sometimes handle non-cardiac or GI  issues, with which the daughter agrees.  I have spoken with [facility], who is still accepting new patients, and the plan will be  for the patient to go there and follow up with Dr. Christiana Fuchs.   The patient's overall disposition is improved, and she is being  discharged home in the care of her daughter.  Please also note that the  patient's bowels did not move during her hospitalization, and she will  like a low-dose laxative prior to discharge.  We will give her  medication for this.      Hollice Espy, M.D.  Electronically Signed     SKK/MEDQ  D:  09/25/2007  T:  09/25/2007  Job:  [id number]   cc:   Christiana Fuchs, M.D.  [facility]   Graylin Shiver, M.D.  Fax: [phone]   Pricilla Riffle, MD, [facility]  [address]  [address]  [place]  [place] [address]

## 2010-09-22 NOTE — H&P (Signed)
[name], [name]            ACCOUNT NO.:  [id number]   MEDICAL RECORD NO.:  [id number]          PATIENT TYPE:  EMS   LOCATION:  MAJO                         FACILITY:  [facility]   PHYSICIAN:  John C. Madilyn Fireman, M.D.    DATE OF BIRTH:  07/14/1914   DATE OF ADMISSION:  09/09/2007  DATE OF DISCHARGE:                              HISTORY & PHYSICAL   CHIEF COMPLAINT:  Rectal bleeding and syncope at the emergency room.   HISTORY OF ILLNESS:  The patient is a 77 year old white female who  underwent colonoscopy and EGD on April 23 with EGD essentially  unrevealing and colonoscopy revealing one transverse and two ascending  colon polyps that were removed.  Her daughter reports her hemoglobin had  been somewhat low at 9.6 prior to the procedures being done.  She had  had history of colon cancer in 2005 and subsequent colonoscopy in 2006.  She did fine until today when she had two bloody bowel movements at  which time her daughter called me and I advised she come to the  emergency room.  Shortly after I was notified that she was in the  emergency room I got an urgent call from Dr. Rosalia Hammers who said she had become  syncopal and apneic but was resuscitated with bagging and IV fluids and  was given O negative blood.  Her initial charted blood pressure was  94/55 sitting with pulse of 85 before her syncopal episode.  She  currently is on a Ventimask and comfortable but pale with systolic blood  pressure in the 90s with her first unit of blood running in.  She has  been on both aspirin and Plavix and the Plavix was held 5 days prior to  the procedure while continuing the aspirin.  The Plavix was started back  3 days ago.   PAST MEDICAL HISTORY:  1. Prinzmetal angina.  2. Hyponatremia.  3. Bronchiectasis.  4. Positive PPD.  5. Gout.  6. Gastroesophageal reflux.  7. History of colon cancer.  8. TIAs.   SURGERIES:  1. Tonsillectomy.  2. Cataract surgery.  3. C-section.   MEDICATIONS:  1. Imdur  once a day, just started.  2. Pepcid 40 mg a day.  3. Folic acid once a day.  4. Allopurinol 300 mg once a day.  5. Plavix 75 mg once a day.  6. Aspirin 81 mg a day.  7. Amlodipine 5 mg once a day.  8. Advair 500/50 once a day.   ALLERGIES:  1. PENICILLIN.  2. NEXIUM.  3. PROTONIX.  4. TETRACYCLINE.   SOCIAL HISTORY:  The patient lives with her daughter.  She is a remote  cigarette smoker and denies alcohol use.   PHYSICAL EXAMINATION:  Pale, pleasant white female, able to respond  appropriately with vital signs as mentioned above.  HEART:  Regular rate and rhythm without murmur.  LUNGS:  Clear.  ABDOMEN:  Soft, nondistended, with normoactive bowel sounds.  No  hepatosplenomegaly, mass or guarding.  She passed two or three more  large bloody bowel movements in the emergency room right before and  after her syncopal  episode.   LABORATORIES:  Hemoglobin 7.3; hematocrit 22; platelets 291,000; WBC  7900.  Troponin negative.   IMPRESSION:  Destabilizing lower gastrointestinal bleed, likely  secondary to previous polypectomy and exacerbated by anticoagulation  with aspirin and Coumadin which she required for a history of transient  ischemic attacks and Prinzmetal angina.   PLAN:  Critical care admission, transfuse and IV fluids as necessary.  We will hold off on colonoscopy as long as she is not stable.  This  would likely have a relatively low yield of successful therapy due to  the amount of blood she is passing and the proximal location of the  polyps.  If urgent intervention is needed would consider percutaneous  approach by interventional radiology with possible embolization of the  bleeding vessel.  Will obviously hold aspirin and Plavix.           ______________________________  Everardo All. Madilyn Fireman, M.D.     JCH/MEDQ  D:  09/09/2007  T:  09/09/2007  Job:  [id number]   cc:   Pricilla Riffle, MD, [facility]  Graylin Shiver, M.D.

## 2010-09-22 NOTE — Consult Note (Signed)
[name], [name]            ACCOUNT NO.:  [id number]   MEDICAL RECORD NO.:  [id number]          PATIENT TYPE:  INP   LOCATION:  2102                         FACILITY:  [facility]   PHYSICIAN:  Jasmine Helling, MD        DATE OF BIRTH:  July 21, 1914   DATE OF CONSULTATION:  09/09/2007  DATE OF DISCHARGE:                                 CONSULTATION   REFERRING PHYSICIAN:  Balinda Quails, MD   [name] is a 77 year old female who is well known to the Pulmonary  Critical Care Service.  She sees Dr. Marcelyn Morton on an outpatient basis  for her bronchiectasis and Mycobacterium avium-intracellulare airway  colonization.  She has a history of diverticular disease and sigmoid  colon cancer.  She had undergone a colonoscopy with polypectomy  approximately 10 days prior to admission.  She does have a history of  TIAs and has been on aspirin and Plavix.  She had the Plavix stopped  prior to the procedure, but was continued on low-dose aspirin.  She  resumed taking her Plavix 3 days prior to admission.  Earlier this  morning, she had a bowel movement, which had blood in it.  She has had 5  to 6 more bowel movements since then with progressively more bleeding.  She called her gastroenterologist and was advised to come to the  hospital.  Upon arrival in the hospital, she had a large bloody bowel  movement and had a syncopal event.  From what I can gather, she never  actually lost her pulse.  When she was laid on the stretcher, she did  regain consciousness and was able to follow commands appropriately.  She  was probably given intravenous fluids and was started on transfusions  with O negative blood.  She was receiving the IV fluids and the  transfusions.  She has improved with the symptoms.  She has not had any  further bowel movements since the one prior to the arrival at the ER.  She says her breathing is at her baseline.  She is not having any chest  pains, palpitations, dizziness, nausea, or  abdominal pain.   PAST MEDICAL HISTORY:  Significant for:  1. Prinzmetal angina.  2. Hypertension.  3. Cesarean section.  4. Bilateral cataracts.  5. Tonsillectomy.  6. Macular degeneration.  7. Syncope.  8. Urinary tract infection.  9. Rectal bleeding.  10.Sigmoid colon carcinoma.  11.Status post sigmoid colectomy in July 2005.  12.Diverticular disease.  13.TIA.  14.Inflammatory arthritis.  15.Gouty arthritis.  16.Renal artery stenosis.  17.PPD positive.  18.Bronchiectasis.  19.Chronic obstructive asthma.  20.Mycobacterium avium-intracellulare airway colonization.  21.Previous history of MRSA.   OUTPATIENT MEDICATIONS:  Include:  1. Advair 500/50 1 puff b.i.d.  2. Pepcid 40 mg daily.  3. Folic acid 1 mg daily.  4. Allopurinol 300 mg daily.  5. Plavix 75 mg daily.  6. Optivar 0.05% 1 drop in each eye daily.  7. Aspirin 81 mg daily.  8. Multivitamin daily.  9. Ocuvite.  10.Norvasc 5 mg daily.  11.Tylenol PM as needed.  12.NyQuil as needed.   ALLERGIES:  She has allergies to PENICILLIN, TETRACYCLINE, PROTONIX, and  CORN MEAL.   SOCIAL HISTORY:  She is a retired Runner, broadcasting/film/video.  She is widowed.  She quit  smoking approximately 50 years ago.  There is no significant alcohol  use.   FAMILY HISTORY:  Significant for her father who had peptic ulcer disease  and mother who had pancreatic cancer.  She has had a brother who had  emphysema.   PHYSICAL EXAMINATION:  She is seen in the emergency room.  She is awake,  alert, and oriented, did not appear to be in any acute distress.  She  does appear pale.  She is able to follow commands.  Temperature is 98.4, heart rate is 73 and regular, blood pressure is  144/76, respiratory rate is 14, and oxygen saturation is 100%.  HEENT:  She has pale sclera.  There is no sinus tenderness.  She has dry  oral mucosa.  There is no lymphadenopathy.  No jugular venous  distention.  HEART: With S1 and S2.  No murmur.  CHEST:  She has coarse  breath sounds bilaterally, but no wheezing or  rales.  ABDOMEN:  Thin, soft, and nontender.  Positive bowel sounds.  No masses.  EXTREMITIES:  Peripheral pulses are palpable.  There is no edema.  NEUROLOGIC:  No focal deficits were appreciated.   LABORATORY DATA:  EKG showed normal sinus rhythm without any acute ST-T  wave changes.  Troponin is less than 0.05.  PT is 14.4 and INR is 1.1.  WBC is 7.9, hemoglobin is 7.3, hematocrit is 22, and platelet count is  291.  PT is 30, sodium is 136, potassium is 4.3, chloride is 107, CO2 is  19, glucose is 132, BUN is 33, creatinine is 1.26, calcium is 8.6, and  anion gap is 10.  Of note is that she had a hemoglobin of 9.6 from  July 07, 2007.   IMPRESSION:  1. Lower gastrointestinal bleeding, status post colonoscopy 10 days      ago with polypectomy.  She does have a history of diverticular      disease as well as a previous colon cancer.  She is currently being      evaluated by Gastroenterology.  This case has also been discussed      with gastroenterologist and Interventional Radiology.  As she has      recurrent bleeding, she may need to have embolization by      Interventional Radiology.  If this is not feasible, an additional      consideration would be to have a surgical evaluation.  2. Syncope with acute blood loss.  She has improved after volume      resuscitation with intravenous fluids as well as transfusions.  She      is to have her hemoglobin monitored and repeat transfusion as      needed.  I would also follow up on her cardiac enzymes, although it      does not appear that she has had an acute coronary event related to      this episode of bleeding.  3. Bronchiectasis with chronic obstructive asthma and history of      Mycobacterium avium and methicillin-resistant Staphylococcus      aureus.  Her respiratory status appears to be at baseline.  I will      continue her on Advair 500/50 one puff b.i.d. as well as Ventolin 2       puffs q.3 h. p.r.n.  I would also titrate her oxygen to keep her      oxygen saturation greater than 92%.  4. Prinzmetal angina.  I will hold her Norvasc for the time being      until she is more hemodynamically stable.  5. History of transient ischemic attack.  We will hold her aspirin and      Plavix until her bleeding issues have resolved.      Jasmine Helling, MD  Electronically Signed     VS/MEDQ  D:  09/09/2007  T:  09/09/2007  Job:  [id number]

## 2010-09-22 NOTE — Assessment & Plan Note (Signed)
[facility]                            CARDIOLOGY OFFICE NOTE   [name], [name]                   MRN:          [id number]  DATE:07/07/2007                            DOB:          [birth year]    IDENTIFICATION:  [name] is a 77 year old woman who I saw back in  February of last year.  She has a history of Prinzmetal's angina,  hyponatremia.   The daughter called in yesterday.  She was driving with her mother to  see her great-grandchild when the patient started having chest pain  radiating to her jaw.  It was 8/10 in intensity.  She took one  nitroglycerin.  It improve some.  She took another, and they were old.  It finally eased off.  The patient did not want to go to the emergency  room.  Otherwise, prior, she had been doing okay, though the daughter  says she is slowing some.  She had another spell about 1 month ago and a  couple of times in the past.  The daughter does say that over the past  couple weeks she has been a little more breathless and lightheaded.   CURRENT MEDICATIONS:  1. Pepcid 40.  2. Folic acid.  3. Allopurinol 300.  4. Plavix 75.  5. Optivar.  6. Aspirin 81.  7. Ocuvite.  8. Multivitamin.  9. Amlodipine 5.  10.Advair 500/50 one puff b.i.d.   PHYSICAL EXAMINATION:  GENERAL:  The patient is in no distress.  VITAL SIGNS:  Blood pressure is 154/69 today, pulse is 90 and regular,  weight 141.  LUNGS:  Clear without wheezes.  CARDIAC:  Regular rate and rhythm, S1 and S2, no murmurs.  ABDOMEN:  Benign.  EXTREMITIES:  No edema.   IMPRESSION:  Chest pain, dyspnea.  The patient has had Myoviews in the  past that were normal, last in 2006.  Again, normal catheterization.  Chest pain is felt to be secondary to the vasospasms.  We have talked  over looking into this with a noninvasive versus invasive.  The patient  would prefer a noninvasive, and we will go ahead and proceed.  I would  continue on her current  regimen for now.  Her blood pressure could allow  an increase in amlodipine.   We will check electrolytes given her history of hyponatremia.  Indeed  question if this may be gastrointestinal, though she is on Pepcid.  I  will be in touch with the patient regarding followup.     Pricilla Riffle, MD, [facility]  Electronically Signed    PVR/MedQ  DD: 07/07/2007  DT: 07/09/2007  Job #: [id number]

## 2010-09-25 NOTE — Assessment & Plan Note (Signed)
[facility]                             PULMONARY OFFICE NOTE   NAME:RICKETTSSimran, [name]                   MRN:          [id number]  DATE:08/16/2006                            DOB:          1915/03/17    SUBJECTIVE:  Ms. Manahan comes in today for followup of her cough and  also shortness of breath.  At the last visit, she was found to have  definite airflow obstruction on spirometry that was at least moderate in  nature.  Her history was most consistent with bronchiectasis and clearly  had been prednisone-responsive in the past.  In order to prevent  frequent prednisone use or higher doses, patient was placed on Advair at  the last visit for that and also for air flow obstruction.  She also  underwent a CT scan of the chest, which did indeed show bronchiectasis  as well as scattered pulmonary nodules bilaterally, right greater than  left, some of which were calcified.  There was also a calcified lymph  node in the right hilum.  All of this is most consistent with old  granulomatous disease.  Patient was also noted to have a dilated  esophagus throughout its course, suggesting esophageal dysmotility.  The  patient, since being off the prednisone and on the Advair, has continued  to have cough with frothy and bubbly mucus.  It has not been purulent.  She has also had significant fatigue.   PHYSICAL EXAMINATION:  VITAL SIGNS:  BP 132/62, pulse 82, temperature  98.  Weight is 133 pounds.  O2 saturation on room air is 94%.   IMPRESSION:  1. Bronchiectasis with significant air flow obstruction:  I would      wonder with her dilated esophagus and definite gastrointestinal      symptoms if she could be having occult reflux which may be      contributing to this.  She is followed by [facility],      and I have sent a copy of this note to them for review.  The      patient does not feel that her symptoms are controlled as well as      being on  the prednisone, and therefore I will increase her Advair      to the 500/50 strength.  My goal here is to try to keep her off      chronic prednisone, if at all possible.  2. Evidence for old granulomatous disease; however, with the patient's      bronchiectasis, I wonder whether she could possibly have      colonization with Mycobacterium avium-intracellulare with some of      her symptoms.  It also makes me have some degree of concern for the      possibility of actual infection.  Will go ahead and try to collect      sputum culture for AFB to try and document whether she does indeed      have MAC colonization.  3. Dilated esophagus on CT scan, which may be suggestive of esophageal      dysmotility.  Patient has been asked to follow up with her      gastroenterologist.   PLAN:  1. Increase Advair to the 500/50 strength b.i.d. to see if we can keep      the patient off systemic steroids.  2. Sputum for AFB as well as CNS.  3. Patient will follow up in a few months or sooner if  there are      problems.     Barbaraann Share, MD,FCCP  Electronically Signed    KMC/MedQ  DD: 08/16/2006  DT: 08/17/2006  Job #: [id number]   cc:   Pricilla Riffle, MD, [facility]

## 2010-09-25 NOTE — Discharge Summary (Signed)
[name], [name]            ACCOUNT NO.:  [id number]   MEDICAL RECORD NO.:  [id number]          PATIENT TYPE:  INP   LOCATION:  3736                         FACILITY:  [facility]   PHYSICIAN:  Hollice Espy, M.D.DATE OF BIRTH:  05/29/1914   DATE OF ADMISSION:  04/18/2006  DATE OF DISCHARGE:  04/25/2006                               DISCHARGE SUMMARY   ANTICIPATED DATE OF DISCHARGE:  April 25, 2006.   PRIMARY CARE PHYSICIAN:  Dr. Zollie Scale of [place] [facility] [place],  although he has left the practice and she will follow up with a  different doctor there.   DISCHARGE DIET:  Low-sodium, 1500-2000 cc fluid restriction diet.   ACTIVITY:  As per home health, which is being set up.   DISPOSITION:  Improved and she is being discharged, initially, into her  daughter's home and then eventually to her own home when she is  physically stronger.   DISCHARGE DIAGNOSES:  1. Hyponatremia believed to be secondary to syndrome of inappropriate      antidiuretic hormone made worse by number 2.  2. Diarrhea caused by colchicine.  3. Hypokalemia, secondary to number 2, now resolved.  4. Pneumonia left lower lobe, now resolved.  5. Hypocalcemia, now resolved.   DISCHARGE MEDICATIONS:  The patient's new medicines will be:  1. Lasix 10 mg every other day.  She will also continue all of her previous medications, these are as  follows:  1. Fosamax 70 mg p.o. q. week.  2. Artifical tears daily.  3. Plavix 75 p.o. daily.  4. Folic acid 1 mg p.o. daily.  5. Aspirin 81 p.o. daily.  6. Prednisone 5 p.o. daily.  7. Allopurinol 300 p.o. every other day.  8. Norvasc 5 p.o. daily.  9. Optivar drop OU b.i.d.  10.Pepcid 40 p.o. b.i.d.  11.Allegra 180 p.o. daily p.r.n.  12.Centrum multivitamin p.o. daily.  13.Ocuvite 1 p.o. b.i.d.  14.Colchicine is being discontinued.   HOSPITAL COURSE:  Patient is an 77 year old white female with a past  medical history of hyponatremia who presented to the  emergency room  after having recently being started on colchicine, started having severe  diarrhea at home and got to the point where she was so weak she was  unable to come in.  In the emergency room, she was found to have a  sodium of 111 with a potassium of 2.9.  Patient was admitted for  hospitalization and treatment for a low sodium, which is felt to be  secondary to diarrhea caused by colchicine.  Patient's colchicine was  stopped, her diarrhea improved.  She was put on IV hydration slowly,  which she tolerated well.  Over the next several days, her sodium  improved and by December 12, it was up to 120.  At this point, she was  on IV fluids and felt to be well hydrated.  Low dose Lasix was added and  her sodium improved to 126 by December 14.  At this point, she is  feeling much stronger as well.  She has worked with physical and  occupational therapy, who recommended home health.  In  regards to her  hypokalemia, she received supplements for this and this resolved as  well.  In regards to her pneumonia, she had a chest x-ray done on  admission, which showed evidence of a small lower lobe infiltrate.  She  was started on antibiotics, completed a full hospital course and this  medicine is being stopped.  Repeat chest x-ray on December 15, showed  resolution of pneumonia.  In discussion with the patient's family, plan  to keep patient's sodium elevated, will be to put her on very, very low  dose Lasix 10 mg every other day, as well as a 1500-2000 cc fluid  restriction.  Patient's daughter is a Designer, jewellery and will aide her  in this.      Hollice Espy, M.D.  Electronically Signed     SKK/MEDQ  D:  04/24/2006  T:  04/25/2006  Job:  [id number]   cc:   [facility]

## 2010-09-25 NOTE — Assessment & Plan Note (Signed)
[facility]                            CARDIOLOGY OFFICE NOTE   [name], [name]                   MRN:          [id number]  DATE:05/13/2006                            DOB:          11/08/1914    IDENTIFICATION:  [name] is an 77 year old woman who I saw back in  2006.  She has a history of shortness of breath, has had a non-invasive  workup with normal perfusion.  Note she had a normal catheterization in  2000, felt to have Prinzmetal's angina.   The patient was just discharged from [facility] for weakness.  She  was found to be profoundly hyponatremic with a sodium of 109.   Since the discharge, she has been doing better.  She was on a sodium  restriction, fluid restriction.   The daughter brings her in today because she has had a few episodes  where she will wake up with jaw pain, no shortness of breath. Question  chest pressure.  These episodes do not happen, however, with activity  and her activity seems to be picking up some.   CURRENT MEDICATIONS:  1. Pepcid b.i.d.  2. Folic acid 1 mg daily.  3. Allopurinol 300 every other day.  4. Plavix 75 daily.  5. Optivar, both eyes at bedtime.  6. Aspirin 81 mg daily.  7. Viactiv p.r.n.  8. Ocuvite 2 daily.  9. Centrum Silver.  10.Norvasc 5 daily.  11.Prednisone 5.  12.Lasix 10 every other day.  13.Fosamax.  14.Tylenol.   PHYSICAL EXAM:  The patient is in no distress.  Blood pressure is 134/70.  Pulse is 84.  Weight 137.  LUNGS:  Clear.  CARDIAC:  Regular rate and rhythm.  S1, S2.  No S3.  EXTREMITIES:  No trivial edema.   IMPRESSION:  I walked with the patient around the clinic.  She did get a  little winded.  No chest pressure.  EKG is unremarkable.  I am not sure  what these spells of jaw pain are and are a little concerning but she is  not having them with activity.   What I have recommended is that we try giving her some nitroglycerin to  take as needed.  She can  cut the tablet into a quarter or a half.  She  says she is feeling a little better if she takes the Plavix and aspirin  at night.  She can do this.  I do not know why this would happen.   I will check a BMET and a CBC today.  Otherwise, I will set to see the  patient back in a couple of months.  Unless her symptoms worsen, I ask  the daughter to call us and tell us how she is doing in the next few  weeks.     Pricilla Riffle, MD, [facility]  Electronically Signed    PVR/MedQ  DD: 05/13/2006  DT: 05/13/2006  Job #: [id number]   cc:   Deboraha Sprang at [employer]

## 2010-09-25 NOTE — Discharge Summary (Signed)
NAME:  [name], [name]                      ACCOUNT NO.:  [id number]   MEDICAL RECORD NO.:  [id number]                   PATIENT TYPE:  INP   LOCATION:  0381                                 FACILITY:  [facility]   PHYSICIAN:  Anselm Pancoast. Zachery Dakins, M.D.          DATE OF BIRTH:  May 21, 1914   DATE OF ADMISSION:  11/08/2003  DATE OF DISCHARGE:  11/18/2003                                 DISCHARGE SUMMARY   DISCHARGE DIAGNOSIS:  Carcinoma of the sigmoid colon.   OPERATION:  Sigmoid colectomy with end-to-end anastomosis, general  anesthesia.   HISTORY OF PRESENT ILLNESS:  [name] is an 77 year old Caucasian  female referred to me by Dr. Evette Cristal for symptomatic colon cancer.  The  patient states she had a problem with significant anemia and was evaluated  with upper endoscopy and then was treated with iron.  She has a history of  esophageal reflux and then still had hemoccult positive stools and then had  a colonoscopy and was noted to have a lesion at approximately 17 cm that was  confirmed to be an adenocarcinoma.  I saw her in the office and she desired  to proceed on with surgery.  Dr. Evette Cristal had done her colonoscopy  approximately 10 days earlier.  She had a GoLYTELY and an  erythromycin/neomycin bowel prep at home and was admitted on November 08, 2003  for the definitive surgery.  She was taken to the operative suite, Dr. Earlene Plater  assisted, and the tumor was probably closely to 20 or 22 cm but we  definitely went low and did an end-to-end anastomosis and postoperatively  she did have a nasogastric tube.  The pathology report returned and this was  a moderately-differentiated adenocarcinoma 2.5 cm and there was also a  second tubulo adenoma high-grade dysplasia within the specimen.  Ten lymph  nodes were negative for carcinoma and the patient did fine postoperatively.  She was real sluggish on getting her bowels working.  We removed the NG tube  on about postoperative day #4, kept her  n.p.o., and then started her on a  liquid diet, advanced to a full liquid, but it was about 3 days later that  she really started having bowel movements and this after some Milk of  Magnesia.  She denies the use of chronic laxatives but her incision is  healing nicely and the staples have been removed and Steri-Strips applied to  the wound.  She is now ready for discharge and we will resume her chronic  medications with the exception that she has been on prednisone for kind of  an asthma/breathing problem and also kind of musculoskeletal aches and pains  and I would hold off on that approximately 2 weeks unless she becomes  symptomatic and she has not been symptomatic during the hospitalization.  She is on colchicine for gout prevention, Pepcid 40 mg b.i.d., a baby  aspirin, Plavix, folic acid, Allegra, and a vitamin tablet.  I will see her  in the office in approximately a week and she will keep the Steri-Strips on  for 4 or 5 days.   LABORATORY STUDIES DURING THIS HOSPITALIZATION:  The chest x-ray showed  changes of COPD.  Hematocrit was 30 with a white blood count in the  postoperative period of about 9000 and also 4000.  Her hematocrit  preoperatively was 35 but that was before any IVs had been started.  Her  liver function studies were normal and she was not transfused.                                               Anselm Pancoast. Zachery Dakins, M.D.    WJW/MEDQ  D:  11/18/2003  T:  11/18/2003  Job:  [id number]   cc:   Graylin Shiver, M.D.  [address]  [address]  [place], [place] [address]  Fax: [phone]   Marcene Duos, M.D.  [phone]  [place]  [place] [address]  Fax: [phone]

## 2010-09-25 NOTE — Consult Note (Signed)
[name], [name]                        ACCOUNT NO.:  [id number]   MEDICAL RECORD NO.:  [id number]                   PATIENT TYPE:  INP   LOCATION:  1823                                 FACILITY:  [facility]   PHYSICIAN:  [name], M.D.                DATE OF BIRTH:  [birth year]   DATE OF CONSULTATION:  03/05/2003  DATE OF DISCHARGE:                                   CONSULTATION   PRIMARY CARE PHYSICIAN:  [name], M.D.   HISTORY OF PRESENT ILLNESS:  The patient is an 77 year old female with an  episode of substernal chest pain one minute in duration this morning for  which she took an aspirin and felt immediately better.  Then at lunch at her  assisted living facility which was a special [place] meal, she went to  get her food, she stood in line for a fair amount of time.  Then she brought  the full plate toward her chair, however, before reaching the chair, she  felt faint and tried to get the nearest chair but does not remember anything  else.  Witnesses say that she lost consciousness for two to three minutes.  She had no seizure-like activity and no postictal symptoms.  Before the  episode, she had no definite knowledge of palpitations, chest pain,  shortness of breath, nausea, or vomiting.  The patient has never had any  prior episodes of syncope.  Upon admission, she was brought to the emergency  room at  [facility] by emergency medical services who did give her  IV Lasix.  She does have hypotension. Her blood pressure lying is 128/59,  sitting is 116/33, and standing 97/44.   PAST MEDICAL HISTORY:  History of Prince-Mettles angina consisting of severe  substernal chest pain with radiation to the right jaw.  This episode  occurred in 2000.  She underwent left heart catheterization at that time by  Dr. Rennis Morton of [place].  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 She was placed on Norvasc  with  relief of symptoms.  She recently moved to [facility].  On arrival, she  had a Cardiolite stress study done in 2002 by Dr. Lucas Morton. Ejection fraction  was 77% and nonischemic study.  She also has a history of transient ischemic  attack, otherwise, she does not have an extensive past medical history.   ALLERGIES:  PENICILLIN, TETRACYCLINE, PROTONIX, AMPICILLIN, CORNMEAL  PRODUCTS.  The Protonix allergy is associated with a workup quite extensive  since May of 2004 for progressive dyspnea.  This has required at least two  admissions to the emergency room.  She has been placed on course of steroids  and courses of antibiotics with minor symptomatic relief.  She was also  started on Protonix for reflux associated dyspnea, but proved intolerant of  Protonix with splinter hemorrhages to the toenails.   MEDICATIONS:  1. Aspirin 325 mg daily.  2. Norvasc 10 mg daily.   SOCIAL HISTORY:  She lives at [facility].  She  lives alone. She is a retired Runner, broadcasting/film/video.  She is a widow.  She does not smoke  and does not take alcoholic beverages.  No drugs.   FAMILY HISTORY:  Her mother died at age 27 of pancreatic cancer.  Father  died at age 65 of perforated ulcer.  She has one brother who has predeceased  her and has a history of hypertension and emphysema.   REVIEW OF SYSTEMS:  The patient does not have recent history of fever,  chills, sweats, weight change.  She is not complaining of waste changes,  vertigo, photophobia, hearing loss.  She has very occasional chest pain.  She does not take nitroglycerin for this because she overreacts to  nitroglycerin and becomes hypotensive.  She has a history of progressive  dyspnea since May of this year.  She does not have orthopnea or paroxysmal  dyspnea.  She is not complaining of edema, palpitations.  She has not had  any prior history of syncope or presyncope.  She does not have claudication  with ambulation.  She does  have cough to clear secretions which at times are  quite thick, in the last five or six months.  No history of depression or  anxiety.  No dysuria or nocturia.  She has no pronounced reflux symptoms, no  protracted nausea and vomiting, no history of GI bleeding.   PHYSICAL EXAMINATION:  VITAL SIGNS:  Temperature 97.4, pulse 65,  respirations 16, oxygen saturation 95% on 2 liters nasal cannula.  She  received IV Lasix today with significant urine output.  Blood pressure in  the emergency room lying 128/59, sitting 116/33, very weak on standing, but  is 97/44.  HEENT:  Normocephalic and atraumatic.  Pupils equal, round, and reactive to  light.  Extraocular movements intact.  Mucous membranes are pink and moist.  The patient is quite thirsty at this time. She is a good historian.  NECK:  Supple. No carotid bruits. No cervical lymphadenopathy.  HEART:  Regular rate and rhythm.  S2 and S1 pronounced with no S4.  LUNGS:  Clear to auscultation and percussion bilaterally.  ABDOMEN:  Soft, nondistended, and bowel sounds are present.  No rebound or  guarding.  The abdominal aorta is nonextensile, nonpulsatile, and no  hepatosplenomegaly.  GENITOURINARY:  RECTAL:  Deferred.  EXTREMITIES:  There is no evidence of cyanosis, clubbing, or edema to the  lower extremities.  No joint deformity.  No costovertebral angle tenderness.  NEUROLOGY:  She is alert and oriented x3.  Cranial nerves II-XII grossly  intact.  She moves both upper and lower extremities appropriately.  She has  palpable pulses at the dorsalis pedis region bilaterally.  Good radial  pulses bilaterally.   Chest x-ray shows cardiomegaly.  Lungs are clear.   Electrocardiogram; rate is 78, sinus rhythm with right bundle branch block.  PR interval of 167, QRS 138, QTC 484, no hypertrophy, no ST T changes.   LABORATORY DATA:  Hemoglobin 12.2, hematocrit 36, platelets 221.  Serum electrolytes; sodium 130, potassium 3.6, chloride 97,  bicarbonate 25, BUN  20, creatinine 1.3, glucose 109.  PTT 32, PT 12.8, INR 0.9.  Urinalysis  shows moderate leukocytes.  Enzymes are consistently negative x3 in the  emergency room.   IMPRESSION:  1. Syncope of uncertain etiology with orthostatic presentation.  2. History of Prince-Mettle angina.  3. Cardiolite in 2002 with ejection fraction of 77%, no ischemia.  4. History of transient ischemic attacks.   PLAN:  Echocardiogram for October 27, gentle hydration.  If echocardiogram  shows normal left ventricular function, will plan Cardiolite.  If she has  recurrent syncope, may need outpatient event monitor given baseline  conduction abnormality.  Will follow in telemetry for now and follow  enzymes.     Maple Mirza, P.A.                    [name], M.D.    GM/MEDQ  D:  03/05/2003  T:  03/05/2003  Job:  [id number]

## 2010-09-25 NOTE — H&P (Signed)
[name], [name]            ACCOUNT NO.:  [id number]   MEDICAL RECORD NO.:  [id number]          PATIENT TYPE:  INP   LOCATION:  2112                         FACILITY:  [facility]   PHYSICIAN:  Kela Millin, M.D.DATE OF BIRTH:  April 18, 1915   DATE OF ADMISSION:  04/18/2006  DATE OF DISCHARGE:                              HISTORY & PHYSICAL   CHIEF COMPLAINT:  Progressive weakness and diarrhea.   HISTORY OF PRESENT ILLNESS:  The patient is an 77 year old white female  with past medical history significant for hypertension, TIA, history of  colon cancer - status post surgery, Prinzmetal angina, gout, who  presents with above complaints over the past 10 days.  She reports that  shortly after her rheumatologist started her on a medication  (?Colchicine), she began having diarrhea and about a week ago she began  taking some Imodium for it.  She states that usually the Imodium will  help for about a day or two, but then the diarrhea comes back and then  she starts taking the Imodium again.  She denies fevers, melena, and no  hematochezia.  She states that she has also become progressively weak to  the extent that in the past 3 days, she has had difficulty walking.  She  admits to a cough productive of yellowish sputum, and some shortness of  breath.  She denies chest pain, orthopnea, PND.  She also denies leg  swelling and no dysuria.   The patient was seen in the ER and lab work revealed a sodium of 111  with a potassium of 2.9.  Her urinalysis revealed trace leukocyte  esterase with 3-6 WBC's and her chest x-ray showed bibasilar atelectasis  and/or scar.  She is admitted to the [facility] for  further evaluation and management.  The patient denies nausea and  vomiting, although she admits to decreased appetite.  No seizures  reported.   PAST MEDICAL HISTORY:  1. As stated above.  2. Also past history of hyponatremia.   MEDICATIONS:  1. Plavix 75 mg daily.  2.  Folic acid 1 mg daily.  3. Norvasc 5 mg daily.  4. Prednisone 5 mg daily.  5. Aspirin 81 mg daily.  6. Allopurinol 300 mg daily.  7. Pepcid 40 mg b.i.d.  8. Optivar.  9. Colchicine.  10.Allegra.  11.Centrum Silver.  12.Imodium.  13.Fosamax.   ALLERGIES:  PENICILLIN, NEXIUM, PROTONIX, TETRACYCLINE.   SOCIAL HISTORY:  She has remote history of smoking for a few years in  high school.  She denies alcohol.   FAMILY HISTORY:  Noncontributory.   REVIEW OF SYSTEMS:  As per HPI.  Other review of systems negative.   PHYSICAL EXAMINATION:  GENERAL:  The patient is a pleasant, elderly  white female.  She appears lethargic, but she is oriented and  appropriate.  In no respiratory distress.  VITAL SIGNS:  Temperature is 97.7, blood pressure 177/90, pulse 87,  respiratory rate 20, and O2 saturation is 95%.  HEENT:  PERRL, EOMI.  Sclerae anicteric.  Dry mucous membranes.  No oral  exudates.  NECK:  Supple, no adenopathy, no thyromegaly.  LUNGS:  Decreased breath sounds at the bases, otherwise clear to  auscultation.  No wheezes.  CARDIOVASCULAR:  Regular rate and rhythm.  Normal S1 and S2.  ABDOMEN:  Soft, bowel sounds present, nontender and nondistended.  No  organomegaly and no masses palpable.  EXTREMITIES:  No cyanosis and no edema.  NEUROLOGY:  She is lethargic, oriented x3, and appropriate.  Cranial  nerves II-XII grossly intact.  Nonfocal examination.   LABORATORY DATA:  Sodium 111, potassium 2.9, chloride 79, BUN 19, and  creatinine 1.2.  Glucose 103, venous pH 7.42, pCO2 37.6, and bicarb  24.7.  White blood cell count 8.4 with a hemoglobin of 14.3, hematocrit  42, platelet count 288, neutrophil count is 77% and her cardiac enzymes;  total CK 482, MB 13.7, relative index is 2.8, and troponin is less than  0.05.   The chest x-ray shows bibasilar atelectasis and/or scar.   ASSESSMENT:  1. Severe hyponatremia.  An 77 year old female presenting with      progressive weakness,  decreased appetite, and diarrhea and serum      sodium found to be 111.  I will hydrate the patient with normal      saline, obtain urine electrolytes, also check a serum cortisol      level and TSH.  Volume depletion, a contributing factor, monitor      BMET's.  2. Diarrhea.  As discussed above obtain stool studies,      hydrate/supportive care, and follow.  3. Hypokalemia, secondary to #2.  Replete and follow.  4. Probable urinary tract infection.  Obtain stool cultures, empiric      antibiotics and follow.  5. Volume depletion.  Hydrate.  6. History of colon cancer, status post sigmoid colectomy with end-to-      end anastomosis.      Kela Millin, M.D.  Electronically Signed     ACV/MEDQ  D:  04/19/2006  T:  04/19/2006  Job:  [id number]

## 2010-09-25 NOTE — Assessment & Plan Note (Signed)
[facility]                             PULMONARY OFFICE NOTE   NAME:[name], [name]                   MRN:          [id number]  DATE:07/20/2006                            DOB:          [birth year]    HISTORY OF PRESENT ILLNESS:  The patient is a very pleasant 77 year old  white female who I have been asked to see for ongoing breathing issues.  The patient states that she has had cough and intermittent shortness of  breath since 2002.  She usually has a dry cough, however the last 2  years has begun to produce clear, bubbly mucus.  The patient was seen by  Dr. Sherene Sires in 2004 and did have airflow obstruction and abnormal flow  volume loop on her spirometry.  However, she was not felt to have  chronic lung disease at that time.  Approximately 2 weeks ago the  patient had some cream colored mucus and has been treated with  antibiotics and prednisone with significant improvement.  The patient's  history is that she has approximately 4-5 flareups a year and will  usually respond quite well to prednisone and antibiotics.  Since being  on prednisone at 5 mg a day her cough is significantly improved.  Apparently she is on this for some type of inflammatory arthritis which  is unknown to me.  The patient states that she will get short of breath  bringing groceries in from the car but has no difficulties making a bed.  It is primarily with moderate exertion that she has difficulties.  Her  weight has been stable and she has had an excellent appetite.  She feels  that she is at a reasonable baseline currently but again has been on  prednisone pulses multiple times, the last being approximately 10 days  ago.   PAST MEDICAL HISTORY:  1. Prinzmetal angina which is being followed by cardiology.  2. History of hyponatremia which is believed to be [facility].  I do not      have records available for that.  3. History of positive PPD years ago with no chemoprophylaxis  given.  4. History of some type of inflammatory arthritis that is followed by      Dr. Vicki Mallet.  5. History of gout.  6. History of GERD.  7. History of colon cancer, treated with surgery but no radiation or      chemotherapy.  8. History of TIA.   CURRENT MEDICATIONS:  1. Norvasc 5 mg daily.  2. Plavix 75 mg daily,  3. Folic acid 1 mg daily.  4. Prednisone 5 mg daily.  5. Aspirin 81 mg daily.  6. Pepcid 40 mg b.i.d.  7. Allopurinol 300 mg Monday, Wednesday, Friday, and Sunday.  8. Lasix 10 mg, which is on hold.  9. Fosamax 70 mg one time a week.  10.Nitroglycerin p.r.n.  11.Allegra 180 daily p.r.n.  12.Various other over the counter medications.   THE PATIENT IS ALLERGIC OR INTOLERANT TO TETRACYCLINE, PENICILLIN, AND  QUESTIONABLY ONE OF THE PROTON PUMP INHIBITORS.   SOCIAL HISTORY:  She is widowed.  She has  a history of smoking 1  cigarette a day for a very short period of time.  She has not smoked  since the 60s.  She is a retired Runner, broadcasting/film/video.   FAMILY HISTORY:  Remarkable for her brother having emphysema and heart  disease.  Her mother also had heart disease and pancreatic cancer.   REVIEW OF SYSTEMS:  As per history of present illness.  Also see patient  intake form documented on the chart.   PHYSICAL EXAMINATION:  In general she is a well-developed female in no  acute distress.  Blood pressure is 110/62, pulse 80, temperature 98,  weight is 135 pounds, and her saturation on room air is 98%.  HEENT:  Pupils equal, round, reactive to light and accommodation,  extraocular muscles are  intact, nares are patent without discharge.  Oropharynx is clear.  NECK:  Supple without JVD or lymphadenopathy.  There is no palpable  thyromegaly.  CHEST:  Reveals decreased breath sounds but no wheezes or crackles.  CARDIAC:  Reveals regular rate and rhythm, no murmurs, rubs, or gallops.  ABDOMEN:  Soft, nontender, with good bowel sounds.  GENITAL AND BREAST:  Not done and not  indicated.  LOWER EXTREMITIES:  Without edema, pulses are intact distally.  NEUROLOGICALLY:  She is alert and oriented with no obvious motor  deficits.   LABORATORY DATA:  Chest x-ray by report shows some type of density in  the right lower lobe and cardiomegaly, however, this is not available to  me.  Apparently this is at [facility].  Spirometry was done today in  the office which shows moderate airflow obstruction with an FEV1 percent  of 59 and a normal FVC.   IMPRESSION:  Moderate airflow obstruction and ongoing cough and dyspnea  which is prednisone responsive.  With this history as well as her chest  x-ray showing some type of abnormality I am very suspicious for her  having bronchiectasis with reactive airways disease.  The patient is not  acting like she has pneumonia clinically and therefore I expect the  density in question is more of a chronic nature.  I will have to get the  x-ray to verify this.  Nevertheless, she clearly has symptoms consistent  with airway inflammation and spasm, and therefore I will go ahead and  start appropriate therapy.   PLAN:  1. Obtain chest x-ray from [facility] for further evaluation.  She      may need CT of the chest.  2. Start Advair 250/50 b.i.d. for airflow obstruction and dyspnea.  3. The patient will follow up in 3 weeks sooner if there is problems.     Barbaraann Share, MD,FCCP  Electronically Signed    [facility]/MedQ  DD: 07/22/2006  DT: 07/23/2006  Job #: [id number]   cc:   Pricilla Riffle, MD, [facility] @ [facility]

## 2010-09-25 NOTE — H&P (Signed)
NAMEONESHA, KREBBS                        ACCOUNT NO.:  [id number]   MEDICAL RECORD NO.:  [id number]                   PATIENT TYPE:  INP   LOCATION:  1823                                 FACILITY:  [facility]   PHYSICIAN:  Marcene Duos, M.D.         DATE OF BIRTH:  1914-05-24   DATE OF ADMISSION:  03/05/2003  DATE OF DISCHARGE:                                HISTORY & PHYSICAL   CHIEF COMPLAINT:  Passed out.   HISTORY OF PRESENT ILLNESS:  This is an 77 year old female who is not a  particularly good historian with a history of what I believe is Prinzmetal  angina.  She presents to the emergency room today with a history of passing  out today just before lunch at her assisted living center, [facility].  The  patient states that she ate about a half of banana and a piece of bread  around 7:30 this morning in anticipation of a large meal with the  Octoberfest celebration at her living center.  At noon, she was getting  ready for lunch outside, had been standing in line to obtain her food, and  was walking back to her table when she felt like she as going to fall,  went to sit down, and the next thing she remembers is waking in an office at  the center.  The patient cannot recall palpitations, definite  lightheadedness, nausea, shortness of breath, or chest pain before the  incident.  She does state that earlier in the morning she had about 30  seconds of sharp sternal chest discomfort for which she took an aspirin.  She states it was more like her anginal pain.  The patient reportedly was  unconscious for about 2 to 3 minutes.  She and her daughter, who is a Engineer, civil (consulting),  are not aware of any seizure-like activity, and none was documented in the  EMS report.  She did, however, have urinary incontinence.   On arrival of EMS, apparently her saturation was 89 to 90%, and she was felt  to have bibasilar crackles. She was given Lasix en route.  The patient  states that she definitely  has had dizziness since then.  Sitting up, she  becomes dizzy now, and she has noticed that her blood pressure has dropped  as her urine output has increased.   The patient has had lightheaded episodes intermittently for some time,  months to years, around mid morning.  A previous GTT workup with Dr.  Johnsie Kindred, her previous physician, was  within normal limits.  Since she has  been followed by myself, we have tried increasing her protein in the morning  without obvious difference, but the patient has not really paid attention as  to whether or not it has made a difference.   In recent months, the patient has also had an extensive pulmonary workup for  frothy cough by Dr. Sherene Sires.  She had been seen at a walk-in  clinic and at my  office for what was felt to be cough and wheezing, and treated as such.  However, after seeing Dr. Sherene Sires and undergoing pulmonary function tests which  were essentially normal, this was felt to be more secondary to GERD.  The  patient, however, did not tolerate PPIs and, in fact, blames recent anorexia  for the past two to three weeks on taking PPIs; and since discontinuing  them, that has definitely improved in her mind.  What has worked best for  her with regards  to this is Careers adviser; however, she states that in the last  three days or so, the frothy cough has returned.   The patient also recently has complained of an inability to walk far without  fatigue and shortness of breath; however, she also feels that she is just  worn out by her extensive workup recently.  She has had no chest pain with  this fatigue and shortness of breath.  The patient did have a workup for  some chest pain I believe back in 2000 in [place].  She underwent a  catheterization that showed noncritical three-vessel coronary artery  disease.  When she moved to [facility], she was eventually reevaluated by  Dr. Lucas Mallow who performed I believe a stress Cardiolite in 2002 that was  normal with  ejection fraction of 77%.  She has also had an echocardiogram  since then.  I do not have that report.    PAST MEDICAL HISTORY:  1. Atypical chest pain felt to be I believe Prinzmetal angina in 2000.  2. Recent heme-positive stools with her fatigue.  On workup in the office,     two of three hemoccult cards were positive; however, she does not have     anemia.  The patient has not noted melena or hematochezia.   PAST SURGICAL HISTORY:  1. T&A.  2. C section x 2.   MEDICATIONS:  1. Norvasc 10 mg daily.  2. Aspirin daily.   ALLERGIES:  PENICILLIN, TETRACYCLINE, PROTONIX.   HABITS:  Tobacco:  She quit 45 years ago, smoked intermittently before that.  Alcohol: None.  Caffeine: Two daily.  Chocolate: Occasionally.   REVIEW OF SYSTEMS:  Heme-positive stools as noted before.  The patient has  never had a colonoscopy.  States that her appetite has significantly  improved recently.   FAMILY HISTORY:  Father died at age 40 of a perforated stomach ulcer.  Mother died at age 60 of pancreatic cancer.  She also had hypertension.  A  brother died at age 80.  He had hypertension, COPD, and was a smoker.  She  has five children, all of whom are healthy.   SOCIAL HISTORY:  The patient has been widowed for five years.  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   PHYSICAL EXAMINATION:  VITAL SIGNS:  Temperature 97.4, blood pressure  120/34, heart rate in the 90s, respiratory rate 20, O2 saturation 92%.  HEENT:  Pupils equal, round, and reactive to light.  Extraocular movements  intact.  Tympanic membranes clear.  Throat is without injection.  She has no  oral lesions or bite marks on her tongue.  NECK:  Supple without adenopathy or thyromegaly.  CHEST:  Clear.  She is nontender to palpation over anterior chest.  CARDIOVASCULAR:  Regular rate and rhythm.  Normal S1 and S2.  No S3, Sr murmur appreciated.  No carotid bruits.  Carotid, radial, femoral, DP, PT  pulses normal  and  equal.  No JVD.  No abdominal bruits.  ABDOMEN:  Soft, nontender.  No organomegaly or masses appreciated.  EXTREMITIES:  Without edema.  NEUROLOGIC:  Alert and oriented x 3.  Cranial nerves II-XII grossly intact.  Motor is 5/5 throughout.   ASSESSMENT/PLAN:  1. Syncopal episode. Check MRA of the brain.  She has no focal findings to     suggest a stroke, but will obtain a neurological consult.  Certainly, I     am concerned that this might be seizure.  Also, cardiac consult.     Daughter prefers Dr. Antoine Poche or associate.  Previously was seen by Dr.     Lucas Mallow.  Will check an EKG as I do not see that one has been done and     place patient on telemetry.  I suspect, however, that this was more     likely a vasovagal episode, but with some of her history, I am concerned     that we may be dealing with source above as problem.  2. Exertional shortness of breath.  Check echocardiogram and again cardiac     consult.  3. What I believe is Prinzmetal angina, though I have not obtained all of     her old records.  Will hold her Norvasc for now with low blood pressure     felt secondary to over diuresis and nitroglycerin p.r.n.  4.     Cough.  Allegra.  5. Low sodium and borderline potassium.  Will replete potassium orally.     Repeat BMET in the morning.  6. Deep vein thrombosis prophylaxis, Lovenox subcutaneously.                                                Marcene Duos, M.D.    EMM/MEDQ  D:  03/05/2003  T:  03/05/2003  Job:  [id number]

## 2010-09-25 NOTE — Op Note (Signed)
NAME:  Jasmine, Morton                      ACCOUNT NO.:  [id number]   MEDICAL RECORD NO.:  [id number]                   PATIENT TYPE:  INP   LOCATION:  0381                                 FACILITY:  [facility]   PHYSICIAN:  Anselm Pancoast. Zachery Dakins, M.D.          DATE OF BIRTH:  16-Oct-1914   DATE OF PROCEDURE:  DATE OF DISCHARGE:                                 OPERATIVE REPORT   PREOPERATIVE DIAGNOSIS:  Carcinoma of the sigmoid colon.   POSTOPERATIVE DIAGNOSIS:  Carcinoma of the sigmoid colon.   OPERATION:  Sigmoid colectomy with end-to-end anastomosis.   ANESTHESIA:  General.   SURGEON:  Anselm Pancoast. Zachery Dakins, M.D.   ASSISTANT:  Sheppard Plumber. Earlene Plater, M.D.   HISTORY:  [name] is an 77 year old female who was referred to me  by Dr. Evette Cristal for a symptomatic colon cancer.  The patient states that she  has had problems with anemia and was evaluated with an upper endoscopy and a  colonoscopy and was noted to have a cancer that was biopsied in the sigmoid  colon, which was measured at 17 cm.  She was referred to me approximately a  week ago and desired to proceed on to surgery as soon as possible.  She has  had a previous evaluation for pulmonary cardiac, as she really presented  with basically passing out back in October, 2004.  At that admission, was  noted to have guaiac positive stools, but the colonoscopy was not performed  until several months later.  Patient states that she has a history of some  mild strokes years earlier, for which she is now on Plavix and aspirin, and  we discontinued the Plavix approximately one week ago in preparation for  surgery.  Her admission laboratory studies showed a hematocrit now of 36 and  normal electrolytes.  She has not had a CT of the abdomen.  Her liver  function tests are normal.  We will inspect the liver carefully at the  surgery.   The patient was taken to the operating suite.  She had GoLYTELY,  erythromycin, and Neomycin bowel prep  last night and yesterday stated that  she did have a little bit of bleeding per rectum with the completion of the  bowel prep.  She was taken back to the operative suite.  Induction of  general anesthesia via endotracheal tube with the orotracheal tube into the  stomach.  The Foley catheter was inserted sterilely, and the abdomen was  prepped with Betadine surgical scrub and solution and draped in a sterile  manner.  She has had previous C-sections x2 with a lower midline incision,  and this was opened up and we carefully entered the peritoneal cavity.  There were some adhesions to the omentum and one little area of small bowel  carefully taken down.  Then the abdomen was inspected.  The left and right  lobes of the liver were normal by palpation and inspection.  I  do not feel  any obvious stones in the gallbladder, and you could feel what appears to be  kind of a small tumor and probably about 5-6 cm above the peritoneal  reflection but kind of curved down on itself.  We elected to kind of  mobilize the descending colon and feel that we can resect this and do an end-  to-end anastomosis.  I suspended the uterus with two stitches to kind of  elevate it to give [place] exposure and then use a Balfour extender to kind of  hold the small bowel open.  Next, the area that proximally was pink, the  mesentery was divided between [facility], and the vessels ligated with 2-0  Vicryl silk, and then the proximal bowel clamped off with an Allen clamp and  the go valve with a Market researcher.  We then divided the mesentery, staying on the  mesentery.  There was no evidence of any large nodes.  We could see where  the left ureter is but did not expose the right ureter.  The mesentery was  divided between [facility] and then kind of came up to the distal sigmoid, and  this is still probably 3-4 cm above the actual rectum, and the tumor by  palpation then appeared to be a little higher than was when it was kind of  tucked back  on itself.  I divided the distal bowel with a Satinsky distally  and then a Kocher and then opened it on the back field.  We could see that  the tumor is probably about 6 cm from the proximal margin with a little bit  longer distal margin.  It was said of and confirmed by Dr. Pershing Proud, and he  says it is actually two lesions that are about 1 cm apart, which the colon  itself is fairly small in this location and the tumor itself is a little  smaller than I had anticipated by the colonoscopy report.  I did an end-to-  end sutured anastomosis, knots inverted 3-0 silk, full-thickness and then  Lembert sutures anterior.  The lumen will admit a finger easily and is  without tension.  The mesenteric defect was closed with 3-0 silk, and then  the small bowel was run and placed in anatomical position.  The omentum  placed over this.  The fascia was closed with a running #1 PDS.  The skin  was closed with staples.  The patient tolerated the procedure nicely.  Estimated blood loss is probably 150 cc.  An NG tube is in good position.  We will keep the NG tube until she starts passing flatus, and we will check  a CBC and BMET in the morning.  Will give her one additional dose of Cipro,  as she is allergic to penicillin, and she had a dose immediately preop.  Sponge and needle counts were correct x2.                                               Anselm Pancoast. Zachery Dakins, M.D.    WJW/MEDQ  D:  11/08/2003  T:  11/08/2003  Job:  [id number]   cc:   Graylin Shiver, M.D.  [address]  [address]  [place], [place] [address]  Fax: [phone]   Marcene Duos, M.D.  [phone]  [place]  [place] [address]  Fax: [phone]

## 2010-09-25 NOTE — Discharge Summary (Signed)
NAMEPATTRICIA, WEIHER                        ACCOUNT NO.:  [id number]   MEDICAL RECORD NO.:  [id number]                   PATIENT TYPE:  INP   LOCATION:  3729                                 FACILITY:  [facility]   PHYSICIAN:  Marcene Duos, M.D.         DATE OF BIRTH:  1914-12-14   DATE OF ADMISSION:  03/05/2003  DATE OF DISCHARGE:  03/08/2003                                 DISCHARGE SUMMARY   ADMISSION DIAGNOSES:  1. Syncopal episode.  2. Exertional dyspnea.  3. Prinzmetal angina.  4. Chronic cough.  5. Hyponatremia.  6. Recent heme positive stool.  7. Atypical chest pain.   DISCHARGE DIAGNOSES:  1. Syncopal episode, probable vasovagal, possibly also secondary to     orthostasis.  2. Exertional dyspnea.  3. Prinzmetal angina.  4. Chronic cough.  5. Hyponatremia.  6. Recent heme positive stool.  7. Atypical chest pain.  8. Urinary tract infection, Escherichia coli pansensitive.   CONSULTATIONS:  1. Olga Millers, M.D. of cardiology on March 05, 2003.  2. Marlan Palau, M.D. of neurology on March 05, 2003.   PROCEDURES:  1. CT scan of head on March 05, 2003.  2. MRI/MRA of brain on March 07, 2003--significant small vessel disease,     no stroke.  3. Adenosine Cardiolite on March 06, 2003--normal.   HISTORY OF PRESENT ILLNESS:  This is an 77 year old female with a history of  what, I believe is Prinzmetal angina. She presented to the emergency room  the day of admission with a history of passing out just as she was sitting  down for lunch at her assisted living center at [facility].  The patient  described a very small breakfast that morning and had stood in line for some  time for her lunch, though this was outside, and as she went to her table  she felt that she was going to fall, went to sit; and the next thing she  remembers was waking in an office in the center. She could not recall  palpitations, definite lightheadedness, nausea, shortness  of breath, or  chest pain before the incident.  She states that earlier in the morning she  had an episode of sharp sternal chest pain that lasted about 30 seconds for  which she took an aspirin.  She states that it was more like her anginal  pain.  The syncopal episode lasted about 2-3 minutes.  They were not aware  of any seizure-like activity; however, she did have urinary incontinence.  Apparently, because her saturation was 89-90% en route to the hospital and  she was felt to have bibasilar crackles she was given Lasix and put out  about 2000 cc of urine in the emergency room.  She complained of being  lightheaded, subsequent to that was sitting up.   The patient with a history of atypical chest pain; had had cardiac  catheterization in 2000 with noncritical 3-vessel disease.  She had  a  Cardiolite with Dr. Lucas Mallow in 2002 with a normal ejection fraction of 74% and  no ischemic changes.   PHYSICAL EXAMINATION:  On exam, the patient's exam was unremarkable.   HOSPITAL COURSE BY PROBLEMS:  Problem 1:  SYNCOPAL EPISODE.  The patient was  admitted to telemetry floor.  She did undergo cardiac evaluation including  adenosine Cardiolite which was negative for ischemic changes.  Serial  cardiac isoenzymes were negative for ischemia.  EKG showed right bundle  branch block with no definite ischemic changes.  She also underwent MRI/MRA  which showed just significant small vessel disease, no stroke, no  vertebrobasilar insufficiency.  She did have generalized slowing on her EEG,  but no other abnormalities.  The consensus was that her episode may have  been from orthostasis; although, again, much of those symptoms began after  her diuresis and her Norvasc has been held; plans for that to continue to be  held unless she has recurrent chest discomfort.  No arrhythmias were noted  during her hospitalization on telemetry.  It was recommended by neurology to  continue with an aspirin daily with her  small-vessel cerebrovascular  disease.   Problem 2:  ATYPICAL CHEST PAIN.  Cardiac workup as above.  This did not  recur during hospitalization.   Problem 3:  COUGH.  This has been a problem since May.  She has had an  extensive pulmonary workup as an outpatient with Dr. Sherene Sires; however, I did  talk to Dr. Bernette Redbird who asked that we consult them as an outpatient.  She does also have 1 heme-positive stool of 3 stool cards and that will need  to be evaluated as well.  She has never had a colonoscopy.   Problem 4:  UTI.  The patient with pyuria on admission which could also have  been contributing to her syncopal episode and urinary incontinence.  The  patient's culture finally returned at the time of discharge with greater  than 100,000 E. coli pansensitive; and she is to be initiated on Bactrim DS  p.o. b.i.d.   CONDITION:  The patient is thus discharged in stable condition.   DISCHARGE MEDICATIONS:  1. Enteric coated aspirin 325 mg daily.  2. To hold the Norvasc.  3. Nitroglycerin 1/150 one sublingual as needed for chest pain.  4. Bactrim DS 1 p.o. b.i.d. for 5 days.  5. Pepcid 40 mg daily at bedtime.  6. Allegra 180 mg once daily.   DISCHARGE INSTRUCTIONS:  1. To follow up with Dr. Delrae Alfred in 1-2 weeks; to call for appointment.  2. To call if any problems and we will get her set up as an outpatient with     Dr. Evette Cristal of gastroenterology.  3. Repeat urinalysis also when she follows up with Dr. Delrae Alfred.                                                Marcene Duos, M.D.    EMM/MEDQ  D:  03/08/2003  T:  03/08/2003  Job:  [id number]

## 2010-09-25 NOTE — Op Note (Signed)
NAME:  [name], [name]                      ACCOUNT NO.:  [id number]   MEDICAL RECORD NO.:  [id number]                   PATIENT TYPE:  AMB   LOCATION:  ENDO                                 FACILITY:  [facility]   PHYSICIAN:  Graylin Shiver, M.D.                DATE OF BIRTH:  July 19, 1914   DATE OF PROCEDURE:  DATE OF DISCHARGE:                                 OPERATIVE REPORT   PROCEDURE:  Colonoscopy with biopsy.   ENDOSCOPIST:  Graylin Shiver, M.D.   INDICATIONS FOR PROCEDURE:  Rectal bleeding.   Informed consent was obtained.  After explanation of the risks of bleeding  infection and perforation.   PREMEDICATIONS:  Fentanyl 37.5 mcg IV, Versed 5 mg IV.   DESCRIPTION OF PROCEDURE:  With the patient in the left lateral decubitus  position a rectal examination was performed.  No masses were felt.  The  Olympus pediatric colonoscope was inserted into the rectum and advanced into  the sigmoid colon.  At 17 cm up in the sigmoid colon there was a mass which  was encircling approximately 1/2 of the lumen.  This was biopsied.  It felt  hard and is compatible with a sigmoid carcinoma.  Pathology, of course, is  pending.   There were diverticula noted in the sigmoid.  The scope was advanced around  the colon to the cecum.  Cecal landmarks were identified. The cecum looked  normal.  In the proximal ascending colon there was a small 3 m polyp  biopsied off.  The rest of the ascending colon looked normal.  The  transverse colon looked normal.  The descending colon showed diverticulosis.  The mass in the sigmoid was again confirmed visually and photographed.  No  lesions were seen in the rectum.  She tolerated the procedure well without  complications.   IMPRESSION:  1. Sigmoid carcinoma, pathology pending.  2. Diverticulosis.  3. Small polyp in the proximal ascending colon.   PLAN:  The pathology will be checked.  A surgical referral will be made.               Graylin Shiver, M.D.   SFG/MEDQ  D:  10/21/2003  T:  10/21/2003  Job:  [id number]   cc:   Marcene Duos, M.D.  [address]  [place]  [place] [address]  Fax: [phone]

## 2010-09-25 NOTE — Consult Note (Signed)
NAMEJACKELYN, ILLINGWORTH                        ACCOUNT NO.:  [id number]   MEDICAL RECORD NO.:  [id number]                   PATIENT TYPE:  INP   LOCATION:  1823                                 FACILITY:  [facility]   PHYSICIAN:  Marlan Palau, M.D.               DATE OF BIRTH:  1914-11-27   DATE OF CONSULTATION:  DATE OF DISCHARGE:                                   CONSULTATION   REASON FOR CONSULTATION:  [name] is an 78 year old right-handed  white female born 04-07-1915 with a history of hypertension.  The patient  comes to the [facility] for an evaluation of a syncopal  event that occurred today while in the cafeteria.  The patient had just  gotten her food, started working to her table and started feeling weak.  She  had to sit down right away.  The patient thinks she got to the chair but  apparently blacked out just as she was sitting down.  The patient woke up on  the floor.  The patient was out for approximately two to three minutes.  The  patient did have urinary incontinence.  Did not bite her tongue.  The  patient was brought through the [facility] for evaluation.  The patient has documented orthostatic hypotension with blood pressures  dropping from 125 systolic range lying to 97 systolic range standing.  The  patient denies any chest pain, palpitations of the chest, and significant  shortness of breath.  The patient feels back to her baseline at this point.   The patient denies any prior history of syncope but does report episodes of  feeling somewhat tired or weak with walking.  The patient has to rest  frequently if she is out doing a lot of walking at the mall or shopping.   PAST MEDICAL HISTORY:  1. Significant for history of syncope.  2. Hypertension.  3. Cesarean section.  4. Status post bilateral cataract surgeries.  5. History of tonsillectomy.  6. Macular degeneration.   MEDICATIONS:  1. Norvasc 10 mg  q.d.  2. Multivitamins 1 q.d.  3. Aspirin 1 q.d.   HABITS:  The patient does not smoke or drink.   ALLERGIES:  PENICILLIN, AMPICILLIN, TETRACYCLINE, PROTONIX.   SOCIAL HISTORY:  This patient is widowed.  Lives in the [place] area at  [place].  Has five children who are alive and well.  Retired.   FAMILY HISTORY:  Mother died from pancreatic cancer at around age 24.  Father died at age 50 with peptic ulcer disease.  The patient's one brother  died of COPD.   REVIEW OF SYMPTOMS:  Notable for no recent fevers, chills.  The patient  denies significant problems with headache, visual field disturbance other  than macular degeneration.  The patient denies any neck pain, chest pain,  shortness of breath, nausea, or vomiting.  The patient denies  any focal  numbness or weakness on the face, arms, or legs.   PHYSICAL EXAMINATION:  VITAL SIGNS:  Blood pressure is 128/59, heart rate is  65, respiratory rate 16, temperature afebrile.  GENERAL:  This patient is a fairly well-developed elderly white female who  is alert and cooperative at the time of examination.  HEENT:  Head is atraumatic.  Pupils have postsurgical reaction to light.  Disk are flat.  NECK:  Supple.  No carotid bruits.  LUNGS:  Respiratory examination is clear.  CARDIOVASCULAR:  Regular rate and rhythm.  No obvious murmurs or rubs noted.  EXTREMITIES:  Without significant edema.  NEUROLOGICAL:  Cranial nerves as above.  Facial symmetry is present.  The  patient has good sensation to face to pinprick and soft touch bilaterally.  Good strength to facial muscles and to muscles with head turning and  shoulder shrug bilaterally.  Speech is well enunciated.  Motor testing  reveals 5/5 strength in all four extremities with good symmetric motor tone  noted throughout.  Sensory testing is intact to pinprick, soft touch, and  vibratory sensation throughout.  The patient has good finger-nose-finger,  toe-to-finger bilaterally.   The patient is not ambulated.  Deep tendon  reflexes are depressed but symmetric.  Toes are neutral bilaterally.  No  pronator drift is seen.   LABORATORY DATA:  Notable for a white count of 6.4, hemoglobin of 12.2,  hematocrit of 36.0, MCV 87.1, platelets of 221,000.  INR of 0.9.  Urinalysis  reveals specific gravity of 1.008, pH of 7.0, 3 to 6 white cells, 0 to 2 red  cells.  Sodium is 130, potassium 3.6, chloride of 97, CO2 of 25, glucose of  109, BUN 20, creatinine 1.3, calcium 8.8.  Total protein of 6.3, albumin  3.7, AST of 24, ALT 23, alkaline phosphate 109, total bilirubin of 0.5.  Troponin I of less than 0.05.   A CT scan of the brain was not performed.   IMPRESSION:  1. History of syncope, etiology unclear.  2. History of hypertension.  The patient appears to be somewhat orthostatic     during this emergency room evaluation.  The patient likely did not have a     neurologic cause of syncope.  I doubt seizure or vertebrobasilar     insufficiency.  Neurology, however, has been consulted to rule out     neurologic causes of syncope.   PLAN:  1. EGG study.  2. MRI of the brain.  3.     MR angiogram.  4. Rule out vertebrobasilar insufficiency.  5. We will follow up on the patient's clinical course while in house.                                               Marlan Palau, M.D.    CKW/MEDQ  D:  03/05/2003  T:  03/05/2003  Job:  [id number]   cc:   Marcene Duos, M.D.  [address] [place]  [place] [address]  Fax: [phone]   [facility]  [address]

## 2010-09-25 NOTE — Assessment & Plan Note (Signed)
[facility]                            CARDIOLOGY OFFICE NOTE   ANNACLAIRE, WALSWORTH                   MRN:          [id number]  DATE:07/08/2006                            DOB:          Jan 24, 1915    IDENTIFICATION:  Ms. Hamlett is an 77 year old woman, last seen in  January.  History of Prinzmetal's.  Note she was hospitalized in the  winter for hyponatremia with a sodium of 109.   When I saw her last I had recommended nitroglycerin as needed.   Since seen she has had a cough.  She is followed in Rheumatology by Dr.  Kellie Simmering, and has been treated for an unusual pulmonary condition, Francella Solian, however, did not feel she had inflammatory condition.  She has  been on prednisone though.  The daughter notes that the past 2 weeks  have been bad.  She is breathing bad, coughing, a lot of sputum.  The  sputum is clear to tannish.  Only 1 episode of chest pain.   CURRENT MEDICATIONS:  1. Norvasc 5.  2. Plavix 75.  3. Folic acid.  4. Prednisone 5.  5. Aspirin 81.  6. Pepcid 40.  7. Allopurinol 300.  8. Lasix 10 four days per week.  9. Fosamax D q. week.  10.Optivar.  11.Centrum Silver.  12.Ocuvite.  13.Tylenol PM.  14.Allegra.   PHYSICAL EXAMINATION:  The patient is in no distress.  Blood pressure is 141/71, pulse is 88 and regular, weight 138, stable.  LUNGS:  Relatively clear, no wheezes, no rales.  CARDIAC:  Regular rate and rhythm, S1, S2, no S3.  ABDOMEN:  Benign.  EXTREMITIES:  No edema.   IMPRESSION:  1. Shortness of breath, cough, sputum.  I will go ahead and check a      BNP, BMET, ESR today and follow up with the patient on this.  I am      not sure what her pulmonary issue is, if it is volume overload,      though it does not appear to be, again she had a normal ejection      fraction back in 2006.  We will continue current medicines for now.  2. Renal artery stenosis.  Has 2 left renal arteries greater than 60%      stenoses,  right renal artery is 1% to 59%, this was done in 2006,      no followup.  Again blood pressure adequate.     Pricilla Riffle, MD, [facility]  Electronically Signed    PVR/MedQ  DD: 07/08/2006  DT: 07/09/2006  Job #: [id number]

## 2010-10-07 ENCOUNTER — Telehealth: Payer: Self-pay | Admitting: Internal Medicine

## 2010-10-07 NOTE — Telephone Encounter (Signed)
Pt need a epi-pen 0.3 mg with one refill to be call in to [facility]/ [address] # [phone]

## 2010-10-08 NOTE — Telephone Encounter (Signed)
Advised patient's daughter that she needs to get refill for the EPI- PEN from her primary care doctor.

## 2010-10-19 ENCOUNTER — Encounter: Payer: Self-pay | Admitting: Internal Medicine

## 2010-10-19 ENCOUNTER — Telehealth: Payer: Self-pay | Admitting: Internal Medicine

## 2010-10-19 DIAGNOSIS — I201 Angina pectoris with documented spasm: Secondary | ICD-10-CM

## 2010-10-19 NOTE — Telephone Encounter (Signed)
I spoke with the pt's daughter and the pt is having increasing frequency in Angina and this is new for the pt.  The pt is having 1-2 episodes per day.  The pt cannot take SL NTG due to it dropping her BP to low and Dr Tenny Craw had told them to shake the bottle and just get powder from bottle and put that on the pt's tongue.  The pt is having to do this a couple of times a day.  The pt's daughter is wondering if the pt can increase her Imdur.  The pt is currently taking Imdur 30mg  daily. The pt will be traveling next month and the daughter would like to get the pt's medications adjusted prior to July.  I will forward this information to Dr Tenny Craw for review.

## 2010-10-19 NOTE — Telephone Encounter (Signed)
Left message for pt's daughter to call back.

## 2010-10-19 NOTE — Telephone Encounter (Signed)
Returning call back to lauren. [phone].

## 2010-10-19 NOTE — Telephone Encounter (Signed)
Pt daughter has question re her mother meds. Pt daughter wants to talk to a nurse.

## 2010-10-20 NOTE — Telephone Encounter (Signed)
Left message for daughter to call us back in regards to Dr Charlott Rakes response

## 2010-10-20 NOTE — Telephone Encounter (Signed)
Patient can increase Imdur to 60;.  Have her see me or scott weaver in the next week.

## 2010-10-21 MED ORDER — ISOSORBIDE MONONITRATE ER 60 MG PO TB24: 30.0000 mg | ORAL_TABLET | Freq: Every day | ORAL | Status: DC

## 2010-10-21 NOTE — Telephone Encounter (Signed)
Spoke with pt's daughter Beverely Low and gave her instructions from Dr. Tenny Craw to increase Imdur to 60 mg by mouth daily. Appt made for pt to see Tereso Newcomer, PA on October 29, 2010 at 3:00.

## 2010-10-21 NOTE — Telephone Encounter (Signed)
Per jennie calling back to speak with nurse .

## 2010-10-26 ENCOUNTER — Telehealth: Payer: Self-pay | Admitting: Internal Medicine

## 2010-10-26 ENCOUNTER — Other Ambulatory Visit: Payer: Self-pay | Admitting: Internal Medicine

## 2010-10-26 NOTE — Telephone Encounter (Signed)
Famotidine 40 mg. [facility] at [address]

## 2010-10-26 NOTE — Telephone Encounter (Signed)
Called pharmacy and LM to get Pepcid refill from primary care doctor.

## 2010-10-29 ENCOUNTER — Ambulatory Visit (INDEPENDENT_AMBULATORY_CARE_PROVIDER_SITE_OTHER): Payer: Medicare Other | Admitting: Physician Assistant

## 2010-10-29 ENCOUNTER — Encounter: Payer: Self-pay | Admitting: Physician Assistant

## 2010-10-29 VITALS — BP 155/72 | HR 86 | Resp 18 | Ht 62.0 in | Wt 140.8 lb

## 2010-10-29 DIAGNOSIS — I208 Other forms of angina pectoris: Secondary | ICD-10-CM

## 2010-10-29 DIAGNOSIS — I2089 Other forms of angina pectoris: Secondary | ICD-10-CM | POA: Insufficient documentation

## 2010-10-29 DIAGNOSIS — I119 Hypertensive heart disease without heart failure: Secondary | ICD-10-CM

## 2010-10-29 DIAGNOSIS — I209 Angina pectoris, unspecified: Secondary | ICD-10-CM

## 2010-10-29 MED ORDER — FAMOTIDINE 40 MG PO TABS: 40.0000 mg | ORAL_TABLET | Freq: Every day | ORAL | Status: DC

## 2010-10-29 MED ORDER — ISOSORBIDE MONONITRATE ER 30 MG PO TB24: 30.0000 mg | ORAL_TABLET | Freq: Every day | ORAL | Status: DC

## 2010-10-29 MED ORDER — AMLODIPINE BESYLATE 5 MG PO TABS: 5.0000 mg | ORAL_TABLET | Freq: Two times a day (BID) | ORAL | Status: DC

## 2010-10-29 NOTE — Progress Notes (Addendum)
History of Present Illness: Primary Cardiologist:  Dr. Dietrich Pates  [name] is a 77 y.o. female with a history of Prinzmetal's angina and SIADH who presents with increasing chest discomfort.  She called in recently and was told to increase her isosorbide.  She was unable tolerate this and cut back to one and a half tablets a day (45 mg).   However, she feels fatigued with this.  She notes a heaviness in her chest and her upper back associated with exertion.  She also notes radiation up into her jaw.  She did try some nitroglycerin past and this improved her symptoms.  Her symptoms got worse and she had to increase her fluid restriction recently for her hyponatremia.  She was able to resume her previous fluid restriction after a couple of weeks.  She notes dyspnea with exertion.  She denies orthopnea or PND or pedal edema.  She denies syncope.  Her chest discomfort has resolved since increasing isosorbide.  She is preparing to leave on vacation in a couple of weeks to [place].  Past Medical History  Diagnosis Date  . Prinzmetal angina     Cardiac catheterization in 2000 with no CAD;  Echo 10/04: Mild AI, probable normal LV function;  Myoview 10/04: No ischemia, no scar, EF 74%  . RAS (renal artery stenosis)     1-59% right renal artery and greater than 60% in 2 left renal arteries 2006  . Hyponatremia     secondary to  SIADH  . GERD (gastroesophageal reflux disease)   . Bronchiectasis without acute exacerbation   . Obstructive chronic bronchitis without exacerbation   . Hypertension   . Gout   . Chronic kidney disease   . Colon cancer     Status post sigmoid colectomy  . History of GI bleed     Current Outpatient Prescriptions  Medication Sig Dispense Refill  . allopurinol (ZYLOPRIM) 100 MG tablet Take 100 mg by mouth daily.        Marland Kitchen amLODipine (NORVASC) 5 MG tablet Take 1 tablet (5 mg total) by mouth daily.  30 tablet  11  . azelastine (OPTIVAR) 0.05 % ophthalmic solution 1  drop 2 (two) times daily.        . clopidogrel (PLAVIX) 75 MG tablet Take 1 tablet (75 mg total) by mouth daily.  30 tablet  11  . colchicine 0.6 MG tablet Take 0.6 mg by mouth every other day.        . diphenoxylate-atropine (LOMOTIL) 2.5-0.025 MG per tablet Take 1 tablet by mouth 4 (four) times daily as needed.        . famotidine (PEPCID) 40 MG tablet Take 40 mg by mouth daily.        . Fluticasone-Salmeterol (ADVAIR DISKUS) 500-50 MCG/DOSE AEPB Inhale 1 puff into the lungs every 12 (twelve) hours.        . folic acid (FOLVITE) 1 MG tablet take 1 tablet by mouth once daily  30 tablet  2  . Iron-Vitamins (GERITOL) LIQD 1 tablespoon once a day       . isosorbide mononitrate (IMDUR) 60 MG 24 hr tablet Take by mouth. Take 1 1/2 tablets daily      . Multiple Vitamins-Minerals (OCUVITE PRESERVISION) TABS Take by mouth 2 (two) times daily.        . nitroGLYCERIN (NITROSTAT) 0.4 MG SL tablet Place 1 tablet (0.4 mg total) under the tongue every 5 (five) minutes as needed.  25 tablet  11  .  DISCONTD: Multiple Vitamins-Minerals (OCUVITE ADULT 50+ PO) Take 1 tablet by mouth 2 (two) times daily.        Marland Kitchen DISCONTD: allopurinol (ZYLOPRIM) 300 MG tablet Take 300 mg by mouth every other day.       Marland Kitchen DISCONTD: isosorbide mononitrate (IMDUR) 60 MG 24 hr tablet Take 1 tablet (60 mg total) by mouth daily.  30 tablet  0    Allergies: Allergies  Allergen Reactions  . Ampicillin   . Esomeprazole Magnesium   . Moxifloxacin   . Pantoprazole Sodium   . Penicillins   . Tetracycline     Social history:  Nonsmoker  Review of systems:  See history of present illness.  She denies fevers, chills, cough, melena, hematochezia.  All other systems reviewed and negative.  Vital Signs: BP 155/72  Pulse 86  Resp 18  Ht 5\' 2"  (1.575 m)  Wt 140 lb 12.8 oz (63.866 kg)  BMI 25.75 kg/m2  PHYSICAL EXAM: Well nourished, well developed, in no acute distress HEENT: normal Neck: no JVD Endocrine: No  thyromegaly Cardiac:  normal S1, S2; RRR; no murmur Lungs:  clear to auscultation bilaterally, no wheezing, rhonchi or rales Abd: soft, nontender, no hepatomegaly Ext: no edema Skin: warm and dry Neuro:  CNs 2-12 intact, no focal abnormalities noted  EKG:  Normal sinus rhythm, heart rate 79, normal axis, right bundle branch block, no significant changes  ASSESSMENT AND PLAN:

## 2010-10-29 NOTE — Patient Instructions (Signed)
Your physician has recommended you make the following change in your medication: DECREASE IMDUR TO 30 MG ONCE DAILY; INCREASE NORVASC 5 MG TWICE DAILY TRY THIS CHANGE FOR 4-5 DAYS AND SEE IF THIS HELPS YOU FEEL BETTER, IF NOT THEN HAVE THE PRESCRIPTIONS FILLED THAT WERE GIVEN TO YOU TODAY.  Your physician recommends that you schedule a follow-up appointment in: 11/09/10 @ 10:15 TO SEE SCOTT WEAVER, PA-C SAME DAY AS DR. ROSS IS IN THE OFFICE

## 2010-10-29 NOTE — Assessment & Plan Note (Signed)
Her symptoms sound consistent with angina.  She has not had these in several years.  She had a normal heart catheterization in 2000.  It would seem unlikely that she would develop significant, obstructive coronary disease in that timeframe.  However, it is possible.  Her last Myoview in 2004 was normal.  Had a discussion with her and her daughter, who is a cardiac care nurse, regarding further workup.  The patient is not interested in proceeding to stress testing or cardiac catheterization this time.  She prefers a strategy of medical therapy at this time.  After discussion, I suggested that she try taking Norvasc 5 mg twice a day and reducing her isosorbide back to 30 mg a day.  Her blood pressures at home tend to run in the 150-160 range systolically.  I think she would be able to tolerate this regimen.  She seems to be somewhat sensitive to increasing dosages of nitrates.  If this regimen is problematic, she can switch back to 45 mg of isosorbide and 5 mg once a day of Norvasc.  I will bring her back in follow up in the next one to 2 weeks to see me on a day that Dr. Tenny Craw is here.

## 2010-11-09 ENCOUNTER — Ambulatory Visit (INDEPENDENT_AMBULATORY_CARE_PROVIDER_SITE_OTHER): Payer: Medicare Other | Admitting: Physician Assistant

## 2010-11-09 ENCOUNTER — Encounter: Payer: Self-pay | Admitting: Physician Assistant

## 2010-11-09 VITALS — BP 146/68 | HR 89 | Resp 16 | Ht 62.0 in | Wt 140.0 lb

## 2010-11-09 DIAGNOSIS — I209 Angina pectoris, unspecified: Secondary | ICD-10-CM

## 2010-11-09 DIAGNOSIS — I10 Essential (primary) hypertension: Secondary | ICD-10-CM

## 2010-11-09 DIAGNOSIS — I2089 Other forms of angina pectoris: Secondary | ICD-10-CM

## 2010-11-09 DIAGNOSIS — I208 Other forms of angina pectoris: Secondary | ICD-10-CM

## 2010-11-09 DIAGNOSIS — I119 Hypertensive heart disease without heart failure: Secondary | ICD-10-CM

## 2010-11-09 MED ORDER — AMLODIPINE BESYLATE 5 MG PO TABS: 5.0000 mg | ORAL_TABLET | Freq: Two times a day (BID) | ORAL | Status: DC

## 2010-11-09 NOTE — Assessment & Plan Note (Signed)
Improved.  Continue current regimen

## 2010-11-09 NOTE — Assessment & Plan Note (Signed)
Improved.  Continue current regimen.  Follow up with Dr. Tenny Craw in 3-4 mos. Or sooner PRN.

## 2010-11-09 NOTE — Progress Notes (Signed)
History of Present Illness: Primary Cardiologist:  Dr. Dietrich Pates  [name] is a 77 y.o. female with a history of Prinzmetal's angina and SIADH who presents with increasing chest discomfort.  She called in recently and was told to increase her isosorbide.  She was unable tolerate this and cut back to one and a half tablets a day (45 mg).   However, she felt fatigued with this.  I saw her a couple of weeks ago to evaluate.  She noted a heaviness in her chest and her upper back associated with exertion.  She also noted radiation up into her jaw.  She did try some nitroglycerin powder and this improved her symptoms.  Her symptoms got worse when she had to increase her fluid restriction recently for her hyponatremia.  She was able to resume her previous fluid restriction after a couple of weeks.  She noted dyspnea with exertion.  She denied orthopnea or PND or pedal edema.  She denied syncope.  Her chest discomfort has resolved since increasing isosorbide.  She is preparing to leave on vacation in a couple of weeks to [place].  After a long discussion with her and her daughter last time, I increased her Norvasc to 5 mg BID and cut her Isosorbide back to 30 mg QD to see if she could tolerate this regimen.  She is brought back today to review her symptoms.  Feels much better.  Had one more episode of chest pain before changing meds.  No more pain since.  She denies significant dyspnea.  No orthopnea, PND.  No syncope.    Past Medical History  Diagnosis Date  . Prinzmetal angina     Cardiac catheterization in 2000 with no CAD;  Echo 10/04: Mild AI, probable normal LV function;  Myoview 10/04: No ischemia, no scar, EF 74%  . RAS (renal artery stenosis)     1-59% right renal artery and greater than 60% in 2 left renal arteries 2006  . Hyponatremia     secondary to  SIADH  . GERD (gastroesophageal reflux disease)   . Bronchiectasis without acute exacerbation   . Obstructive chronic bronchitis  without exacerbation   . Hypertension   . Gout   . Chronic kidney disease   . Colon cancer     Status post sigmoid colectomy  . History of GI bleed     Current Outpatient Prescriptions  Medication Sig Dispense Refill  . allopurinol (ZYLOPRIM) 100 MG tablet Take 100 mg by mouth daily.        Marland Kitchen amLODipine (NORVASC) 5 MG tablet Take 1 tablet (5 mg total) by mouth 2 (two) times daily.  60 tablet  11  . azelastine (OPTIVAR) 0.05 % ophthalmic solution 1 drop 2 (two) times daily.        . clopidogrel (PLAVIX) 75 MG tablet Take 1 tablet (75 mg total) by mouth daily.  30 tablet  11  . colchicine 0.6 MG tablet Take 0.6 mg by mouth every other day.        . diphenoxylate-atropine (LOMOTIL) 2.5-0.025 MG per tablet Take 1 tablet by mouth 4 (four) times daily as needed.        . famotidine (PEPCID) 40 MG tablet Take 1 tablet (40 mg total) by mouth daily.  30 tablet  11  . Fluticasone-Salmeterol (ADVAIR DISKUS) 500-50 MCG/DOSE AEPB Inhale 1 puff into the lungs every 12 (twelve) hours.        . folic acid (FOLVITE) 1 MG tablet take  1 tablet by mouth once daily  30 tablet  2  . Iron-Vitamins (GERITOL) LIQD 1 tablespoon once a day       . isosorbide mononitrate (IMDUR) 30 MG 24 hr tablet Take 1 tablet (30 mg total) by mouth daily.  30 tablet  11  . Multiple Vitamins-Minerals (OCUVITE PRESERVISION) TABS Take by mouth 2 (two) times daily.        . nitroGLYCERIN (NITROSTAT) 0.4 MG SL tablet Place 1 tablet (0.4 mg total) under the tongue every 5 (five) minutes as needed.  25 tablet  11  . DISCONTD: amLODipine (NORVASC) 5 MG tablet Take 1 tablet (5 mg total) by mouth 2 (two) times daily.  30 tablet  11    Allergies: Allergies  Allergen Reactions  . Ampicillin   . Esomeprazole Magnesium   . Moxifloxacin   . Pantoprazole Sodium   . Penicillins   . Tetracycline     Social history:  Nonsmoker  Vital Signs: BP 146/68  Pulse 89  Resp 16  Ht 5\' 2"  (1.575 m)  Wt 140 lb (63.504 kg)  BMI 25.61  kg/m2  PHYSICAL EXAM: Well nourished, well developed, in no acute distress HEENT: normal Neck: no JVD Cardiac:  normal S1, S2; RRR; no murmur Lungs:  clear to auscultation bilaterally, no wheezing, rhonchi or rales Abd: soft, nontender, no hepatomegaly Ext: no edema Skin: warm and dry Neuro:  CNs 2-12 intact, no focal abnormalities noted   ASSESSMENT AND PLAN:

## 2010-11-09 NOTE — Patient Instructions (Signed)
Your physician recommends that you schedule a follow-up appointment in: 3-4 months with Dr. Tenny Craw as per Tereso Newcomer, PA-C  A PRESCRIPTION WAS SENT IN TODAY FOR NORVASC 5 MG 1 TABLET TWICE DAILY

## 2010-12-25 ENCOUNTER — Other Ambulatory Visit: Payer: Self-pay | Admitting: Internal Medicine

## 2011-01-04 ENCOUNTER — Other Ambulatory Visit: Payer: Self-pay | Admitting: Internal Medicine

## 2011-01-04 ENCOUNTER — Encounter: Payer: Self-pay | Admitting: Physician Assistant

## 2011-01-06 ENCOUNTER — Encounter: Payer: Self-pay | Admitting: Physician Assistant

## 2011-01-06 MED ORDER — FOLIC ACID 1 MG PO TABS: 1.0000 mg | ORAL_TABLET | Freq: Every day | ORAL | Status: DC

## 2011-01-06 NOTE — Telephone Encounter (Signed)
Pt waiting at pharmacy for folic acid refill. Pharmacy is asking that refill request be expedited ASAP.

## 2011-02-03 LAB — URINALYSIS, ROUTINE W REFLEX MICROSCOPIC
Glucose, UA: NEGATIVE
Ketones, ur: NEGATIVE
Protein, ur: 30 — AB
Urobilinogen, UA: 0.2

## 2011-02-03 LAB — POCT I-STAT, CHEM 8
BUN: 28 — ABNORMAL HIGH
Calcium, Ion: 1.14
Creatinine, Ser: 1.4 — ABNORMAL HIGH
Glucose, Bld: 112 — ABNORMAL HIGH
Hemoglobin: 12.6
TCO2: 23

## 2011-02-03 LAB — LEGIONELLA ANTIGEN, URINE: Legionella Antigen, Urine: NEGATIVE

## 2011-02-03 LAB — DIFFERENTIAL
Basophils Relative: 0
Eosinophils Absolute: 0.1
Eosinophils Relative: 0
Lymphs Abs: 0.8
Monocytes Absolute: 1.5 — ABNORMAL HIGH
Monocytes Relative: 11
Monocytes Relative: 15 — ABNORMAL HIGH
Neutro Abs: 11 — ABNORMAL HIGH
Neutrophils Relative %: 70

## 2011-02-03 LAB — COMPREHENSIVE METABOLIC PANEL
ALT: 14
Alkaline Phosphatase: 125 — ABNORMAL HIGH
CO2: 23
Calcium: 8.1 — ABNORMAL LOW
GFR calc non Af Amer: 43 — ABNORMAL LOW
Glucose, Bld: 109 — ABNORMAL HIGH
Potassium: 4
Sodium: 132 — ABNORMAL LOW

## 2011-02-03 LAB — CBC
HCT: 29.5 — ABNORMAL LOW
HCT: 35.5 — ABNORMAL LOW
Hemoglobin: 11.7 — ABNORMAL LOW
Hemoglobin: 9.9 — ABNORMAL LOW
MCHC: 33.5
MCHC: 33.6
MCV: 84.3
MCV: 84.4
Platelets: 220
Platelets: 267
RBC: 3.5 — ABNORMAL LOW
RDW: 17.8 — ABNORMAL HIGH
WBC: 13.3 — ABNORMAL HIGH

## 2011-02-03 LAB — CULTURE, BLOOD (ROUTINE X 2): Culture: NO GROWTH

## 2011-02-03 LAB — EXPECTORATED SPUTUM ASSESSMENT W GRAM STAIN, RFLX TO RESP C

## 2011-02-03 LAB — URINE MICROSCOPIC-ADD ON

## 2011-02-03 LAB — CULTURE, RESPIRATORY W GRAM STAIN

## 2011-02-03 LAB — BASIC METABOLIC PANEL
BUN: 14
CO2: 24
Chloride: 106
Creatinine, Ser: 1.05

## 2011-02-03 LAB — CARDIAC PANEL(CRET KIN+CKTOT+MB+TROPI): Total CK: 23

## 2011-02-03 LAB — URINE CULTURE

## 2011-02-03 LAB — CK TOTAL AND CKMB (NOT AT ARMC)
CK, MB: 0.8
Relative Index: INVALID

## 2011-02-03 LAB — MAGNESIUM: Magnesium: 2.1

## 2011-02-24 ENCOUNTER — Other Ambulatory Visit: Payer: Self-pay | Admitting: Pulmonary Disease

## 2011-03-04 ENCOUNTER — Encounter: Payer: Self-pay | Admitting: Internal Medicine

## 2011-03-04 ENCOUNTER — Ambulatory Visit (INDEPENDENT_AMBULATORY_CARE_PROVIDER_SITE_OTHER): Payer: Medicare Other | Admitting: Internal Medicine

## 2011-03-04 DIAGNOSIS — I201 Angina pectoris with documented spasm: Secondary | ICD-10-CM

## 2011-03-04 DIAGNOSIS — E871 Hypo-osmolality and hyponatremia: Secondary | ICD-10-CM

## 2011-03-04 DIAGNOSIS — I119 Hypertensive heart disease without heart failure: Secondary | ICD-10-CM

## 2011-03-04 DIAGNOSIS — I10 Essential (primary) hypertension: Secondary | ICD-10-CM

## 2011-03-04 MED ORDER — NITROGLYCERIN 0.4 MG SL SUBL: 0.4000 mg | SUBLINGUAL_TABLET | SUBLINGUAL | Status: DC | PRN

## 2011-03-04 MED ORDER — ISOSORBIDE MONONITRATE ER 30 MG PO TB24: 30.0000 mg | ORAL_TABLET | Freq: Every day | ORAL | Status: DC

## 2011-03-04 MED ORDER — AMLODIPINE BESYLATE 5 MG PO TABS: 5.0000 mg | ORAL_TABLET | Freq: Two times a day (BID) | ORAL | Status: DC

## 2011-03-04 MED ORDER — CLOPIDOGREL BISULFATE 75 MG PO TABS: 75.0000 mg | ORAL_TABLET | Freq: Every day | ORAL | Status: DC

## 2011-03-04 NOTE — Progress Notes (Signed)
HPI Patient  Is a 77 year old with a history of Prinzmetal's angina and SIADH.  She was last seen by Wende Mott in July.  At that time she was having more chest pain He increased her Norvasc to 5 bid.  Continued imdur 30. Since then she has done well.  She has had only a couple episodes of CP.  Breathing is OK.    Allergies  Allergen Reactions  . Ampicillin   . Esomeprazole Magnesium   . Levaquin   . Pantoprazole Sodium   . Penicillins   . Tetracycline     Current Outpatient Prescriptions  Medication Sig Dispense Refill  . allopurinol (ZYLOPRIM) 100 MG tablet Take 100 mg by mouth daily.        Marland Kitchen amLODipine (NORVASC) 5 MG tablet Take 5 mg by mouth daily.        Marland Kitchen azelastine (OPTIVAR) 0.05 % ophthalmic solution 1 drop 2 (two) times daily.        . clopidogrel (PLAVIX) 75 MG tablet Take 1 tablet (75 mg total) by mouth daily.  30 tablet  11  . colchicine 0.6 MG tablet Take 0.6 mg by mouth every other day.        . diphenoxylate-atropine (LOMOTIL) 2.5-0.025 MG per tablet Take 1 tablet by mouth 4 (four) times daily as needed.        . famotidine (PEPCID) 40 MG tablet Take 1 tablet (40 mg total) by mouth daily.  30 tablet  11  . Fluticasone-Salmeterol (ADVAIR) 250-50 MCG/DOSE AEPB Inhale 1 puff into the lungs every 12 (twelve) hours.        . folic acid (FOLVITE) 1 MG tablet take 1 tablet by mouth once daily  30 tablet  2  . folic acid (FOLVITE) 1 MG tablet Take 1 tablet (1 mg total) by mouth daily.  30 tablet  1  . Iron-Vitamins (GERITOL) LIQD 1 tablespoon once a day       . isosorbide mononitrate (IMDUR) 30 MG 24 hr tablet Take 1 tablet (30 mg total) by mouth daily.  30 tablet  11  . Multiple Vitamins-Minerals (OCUVITE PRESERVISION) TABS Take by mouth 2 (two) times daily.        . nitroGLYCERIN (NITROSTAT) 0.4 MG SL tablet Place 1 tablet (0.4 mg total) under the tongue every 5 (five) minutes as needed.  25 tablet  11    Past Medical History  Diagnosis Date  . Prinzmetal angina    Cardiac catheterization in 2000 with no CAD;  Echo 10/04: Mild AI, probable normal LV function;  Myoview 10/04: No ischemia, no scar, EF 74%  . RAS (renal artery stenosis)     1-59% right renal artery and greater than 60% in 2 left renal arteries 2006  . Hyponatremia     secondary to  SIADH  . GERD (gastroesophageal reflux disease)   . Bronchiectasis without acute exacerbation   . Obstructive chronic bronchitis without exacerbation   . Hypertension   . Gout   . Chronic kidney disease   . Colon cancer     Status post sigmoid colectomy  . History of GI bleed     No past surgical history on file.  No family history on file.  History   Social History  . Marital Status: Widowed    Spouse Name: N/A    Number of Children: N/A  . Years of Education: N/A   Occupational History  . Not on file.   Social History Main Topics  .  Smoking status: Never Smoker   . Smokeless tobacco: Never Used  . Alcohol Use: No  . Drug Use: No  . Sexually Active: Not on file   Other Topics Concern  . Not on file   Social History Narrative  . No narrative on file    Review of Systems:  All systems reviewed.  They are negative to the above problem except as previously stated.  Vital Signs: BP 134/68  Pulse 84  Ht 5\' 1"  (1.549 m)  Wt 136 lb (61.689 kg)  BMI 25.70 kg/m2  Physical Exam Patient is in NAD  HEENT:  Normocephalic, atraumatic. EOMI, PERRLA.  Neck: JVP is normal. No thyromegaly. No bruits.  Lungs: clear to auscultation. No rales no wheezes.  Heart: Regular rate and rhythm. Normal S1, S2. No S3.   No significant murmurs. PMI not displaced.  Abdomen:  Supple, nontender. Normal bowel sounds. No masses. No hepatomegaly.  Extremities:   Good distal pulses throughout. Bruise on L foot with swelling ( toy fell on foot) Musculoskeletal :moving all extremities.  Neuro:   alert and oriented x3.  CN II-XII grossly intact.  EKG:  Sinus rhythm.  84 bpm.  RBBB>  LVH with strain pattern.   Occasional PVC.  Assessment and Plan:

## 2011-03-04 NOTE — Assessment & Plan Note (Signed)
Adequate control. 

## 2011-03-04 NOTE — Assessment & Plan Note (Signed)
Followed by Dr Colodonato 

## 2011-03-04 NOTE — Assessment & Plan Note (Signed)
Doing well.  Keep on same regimen.

## 2011-03-05 ENCOUNTER — Other Ambulatory Visit: Payer: Self-pay | Admitting: *Deleted

## 2011-03-05 DIAGNOSIS — I119 Hypertensive heart disease without heart failure: Secondary | ICD-10-CM

## 2011-03-29 ENCOUNTER — Telehealth: Payer: Self-pay | Admitting: Pulmonary Disease

## 2011-03-29 MED ORDER — FLUTICASONE-SALMETEROL 250-50 MCG/DOSE IN AEPB: 1.0000 | INHALATION_SPRAY | Freq: Two times a day (BID) | RESPIRATORY_TRACT | Status: DC

## 2011-03-29 NOTE — Telephone Encounter (Signed)
Caller advised pt is doing well with Advair and is requesting to get the pt through the holiday's on refills. Scheduled with [facility] 05/17/11 for follow up. Rx sent to pharmacy electronically.

## 2011-03-31 ENCOUNTER — Telehealth: Payer: Self-pay | Admitting: Pulmonary Disease

## 2011-03-31 MED ORDER — FLUTICASONE-SALMETEROL 500-50 MCG/DOSE IN AEPB: 1.0000 | INHALATION_SPRAY | Freq: Two times a day (BID) | RESPIRATORY_TRACT | Status: DC

## 2011-03-31 NOTE — Telephone Encounter (Signed)
I spoke with pt daughter and she states wrong rx for advair was sent in for pt. She states advair 250-50 was sent instead of advair 500/50. She states they did not realize it until last night. I advised will call pharmacy and get rx correct. She verbalized understanding. I have called [facility] and corrected RX. Pt daughter states they are going to go and p/u right rx for pt today. Nothing further was needed

## 2011-04-02 ENCOUNTER — Other Ambulatory Visit: Payer: Self-pay | Admitting: Internal Medicine

## 2011-04-02 NOTE — Telephone Encounter (Signed)
rx sent in today

## 2011-04-20 ENCOUNTER — Telehealth: Payer: Self-pay | Admitting: Pulmonary Disease

## 2011-04-20 NOTE — Telephone Encounter (Signed)
Per Beverely Low, pt will need 1 additional refill on her Advair before coming for OV on 05/17/11 w/ [facility]. We called in refills for this medication on 03/31/11 and there should be 1 remaining refill left. I called and spoke with the pharmacist and they had mistakenly put refills on Advair 250/50 and not the 500/50. I had the pharmacist  Transfer the remaining refill to the Advair 500/50 and cancel the one on the Advair 250/50. Jeannie made aware.

## 2011-05-17 ENCOUNTER — Encounter: Payer: Self-pay | Admitting: Pulmonary Disease

## 2011-05-17 ENCOUNTER — Ambulatory Visit (INDEPENDENT_AMBULATORY_CARE_PROVIDER_SITE_OTHER): Payer: Medicare Other | Admitting: Pulmonary Disease

## 2011-05-17 DIAGNOSIS — J479 Bronchiectasis, uncomplicated: Secondary | ICD-10-CM

## 2011-05-17 DIAGNOSIS — J449 Chronic obstructive pulmonary disease, unspecified: Secondary | ICD-10-CM | POA: Insufficient documentation

## 2011-05-17 MED ORDER — FLUTICASONE-SALMETEROL 500-50 MCG/DOSE IN AEPB: 1.0000 | INHALATION_SPRAY | Freq: Two times a day (BID) | RESPIRATORY_TRACT | Status: DC

## 2011-05-17 NOTE — Assessment & Plan Note (Signed)
The patient has been doing well from this standpoint, with no recent significant infection.

## 2011-05-17 NOTE — Progress Notes (Signed)
  Subjective:    Patient ID: [name], female    DOB: 1914/07/10, 78 y.o.   MRN: [id number]  HPI The patient comes in today for followup of her known bronchiectasis with associated obstructive lung disease.  She is maintained on Advair compliantly, has not had any significant chest infection or acute exacerbation.  She also has a history of hyponatremia that may be related to her lung disease, and is followed by nephrology for this.  Patient denies any significant cough, congestion, or purulent mucus currently.   Review of Systems  Constitutional: Negative for fever and unexpected weight change.  HENT: Positive for postnasal drip. Negative for ear pain, nosebleeds, congestion, sore throat, rhinorrhea, sneezing, trouble swallowing, dental problem and sinus pressure.   Eyes: Negative for redness and itching.  Respiratory: Positive for cough. Negative for chest tightness, shortness of breath and wheezing.   Cardiovascular: Negative for palpitations and leg swelling.  Gastrointestinal: Negative for nausea and vomiting.  Genitourinary: Negative for dysuria.  Musculoskeletal: Positive for joint swelling.  Skin: Negative for rash.  Neurological: Negative for headaches.  Hematological: Does not bruise/bleed easily.  Psychiatric/Behavioral: Negative for dysphoric mood. The patient is not nervous/anxious.        Objective:   Physical Exam Frail female in no acute distress Nose a without purulence or discharge noted Chest with very mild decrease in breath sounds, a few scattered crackles, no wheezes Cardiac exam with regular rate and rhythm Lower extremities with mild ankle edema, no cyanosis Alert and oriented, moves all 4 extremities.       Assessment & Plan:

## 2011-05-17 NOTE — Patient Instructions (Signed)
Continue on advair Stay as active as possible. followup with me in one year, but sooner if having breathing issues.

## 2011-05-17 NOTE — Assessment & Plan Note (Signed)
The patient has known COPD that is related to her underlying bronchiectasis.  She has done very well on Advair, and feels that her breathing is stable.  I have considered trying to decrease her dose to 250/50 strength, but she is doing so well on her current medication I will leave well enough alone (especially considering her age).

## 2011-07-03 ENCOUNTER — Other Ambulatory Visit: Payer: Self-pay | Admitting: Internal Medicine

## 2011-07-23 ENCOUNTER — Other Ambulatory Visit: Payer: Self-pay | Admitting: Internal Medicine

## 2011-08-05 ENCOUNTER — Other Ambulatory Visit: Payer: Self-pay | Admitting: *Deleted

## 2011-08-09 ENCOUNTER — Telehealth: Payer: Self-pay | Admitting: Pulmonary Disease

## 2011-08-09 MED ORDER — CIPROFLOXACIN HCL 500 MG PO TABS: 500.0000 mg | ORAL_TABLET | Freq: Two times a day (BID) | ORAL | Status: AC

## 2011-08-09 NOTE — Telephone Encounter (Signed)
Ok with me to call in cipro 500mg  one in am and pm for 7 days. Let us know if she doesn't improve.

## 2011-08-09 NOTE — Telephone Encounter (Signed)
Returning call can be reached at [phone].Raylene Everts

## 2011-08-09 NOTE — Telephone Encounter (Signed)
Called spoke with Beverely Low, who reports that she believes pt had run out of her advair last week (3.28.13) and she began having some congestion with clear mucus.  Had "copious amounts" of mucus on 3.30.13, but this basically stopped on 3.31.13.  Jeannie stated that pt is c/o tightness in her LLL, feels rattling and wheezing in that area as well, increased SOB, increased fatigue.  Denied f/c/s.  Wonders if this may be going into PNA.  Beverely Low is requesting a rx for cipro as this has worked well for pt in the past.  Offered work-in appt with TP this afternoon, but Beverely Low declined stating that she is a critical care nurse and feels that she "can keep an eye" on pt and that d/t pt's age it is difficult for her.  [facility] [address]  Dr Shelle Iron please advise, thanks.    *last ov with [facility] 1.7.13, follow up in 1 year.   Allergies  Allergen Reactions  . Ampicillin   . Esomeprazole Magnesium   . Levaquin   . Pantoprazole Sodium   . Penicillins   . Tetracycline

## 2011-08-09 NOTE — Telephone Encounter (Signed)
lmomtcb x1 for [employer]

## 2011-08-09 NOTE — Telephone Encounter (Signed)
I spoke with [name] and she is aware of [facility] recs. Rx has been sent to the pharmacy and nothing further was needed

## 2011-08-13 ENCOUNTER — Encounter: Payer: Self-pay | Admitting: Internal Medicine

## 2011-09-06 ENCOUNTER — Ambulatory Visit: Payer: Medicare Other | Admitting: Pulmonary Disease

## 2011-10-07 ENCOUNTER — Ambulatory Visit (INDEPENDENT_AMBULATORY_CARE_PROVIDER_SITE_OTHER): Payer: Medicare Other | Admitting: Internal Medicine

## 2011-10-07 ENCOUNTER — Encounter: Payer: Self-pay | Admitting: Internal Medicine

## 2011-10-07 VITALS — BP 130/70 | HR 80 | Ht 60.0 in | Wt 129.0 lb

## 2011-10-07 DIAGNOSIS — I119 Hypertensive heart disease without heart failure: Secondary | ICD-10-CM

## 2011-10-07 DIAGNOSIS — I201 Angina pectoris with documented spasm: Secondary | ICD-10-CM

## 2011-10-07 MED ORDER — FAMOTIDINE 40 MG PO TABS: 40.0000 mg | ORAL_TABLET | Freq: Every day | ORAL | Status: DC

## 2011-10-07 MED ORDER — AMLODIPINE BESYLATE 5 MG PO TABS: 5.0000 mg | ORAL_TABLET | Freq: Two times a day (BID) | ORAL | Status: DC

## 2011-10-07 MED ORDER — ISOSORBIDE MONONITRATE ER 30 MG PO TB24: 30.0000 mg | ORAL_TABLET | Freq: Every day | ORAL | Status: DC

## 2011-10-07 MED ORDER — FOLIC ACID 1 MG PO TABS: 1.0000 mg | ORAL_TABLET | Freq: Every day | ORAL | Status: DC

## 2011-10-07 MED ORDER — CLOPIDOGREL BISULFATE 75 MG PO TABS: 75.0000 mg | ORAL_TABLET | Freq: Every day | ORAL | Status: DC

## 2011-10-07 NOTE — Patient Instructions (Signed)
Your physician wants you to follow-up in:9 to 10 months You will receive a reminder letter in the mail two months in advance. If you don't receive a letter, please call our office to schedule the follow-up appointment.

## 2011-10-07 NOTE — Progress Notes (Signed)
HPI Patient is a 13 year odl with a history of Prinzmetal's angina and SIADH.  She was lat in clinic in October.  Since seen breathing is steady.  No CP  A little unsteady but not dizzy.  Appetite is good.  Allergies  Allergen Reactions  . Ampicillin   . Esomeprazole Magnesium   . Levofloxacin   . Pantoprazole Sodium   . Penicillins   . Tetracycline     Current Outpatient Prescriptions  Medication Sig Dispense Refill  . allopurinol (ZYLOPRIM) 100 MG tablet Take 100 mg by mouth daily.        Marland Kitchen amLODipine (NORVASC) 5 MG tablet Take 1 tablet (5 mg total) by mouth 2 (two) times daily.  60 tablet  11  . azelastine (OPTIVAR) 0.05 % ophthalmic solution 1 drop 2 (two) times daily.        . clopidogrel (PLAVIX) 75 MG tablet Take 1 tablet (75 mg total) by mouth daily.  30 tablet  11  . colchicine 0.6 MG tablet Take 0.6 mg by mouth every other day.        . diphenoxylate-atropine (LOMOTIL) 2.5-0.025 MG per tablet Take 1 tablet by mouth 4 (four) times daily as needed.        . famotidine (PEPCID) 40 MG tablet Take 1 tablet (40 mg total) by mouth daily.  30 tablet  11  . Fluticasone-Salmeterol (ADVAIR DISKUS) 500-50 MCG/DOSE AEPB Inhale 1 puff into the lungs 2 (two) times daily.  60 each  12  . folic acid (FOLVITE) 1 MG tablet take 1 tablet by mouth once daily  30 tablet  2  . Iron-Vitamins (GERITOL) LIQD 1 tablespoon once a day       . isosorbide mononitrate (IMDUR) 30 MG 24 hr tablet Take 1 tablet (30 mg total) by mouth daily.  30 tablet  11  . Multiple Vitamins-Minerals (OCUVITE PRESERVISION) TABS Take by mouth 2 (two) times daily.        . nitroGLYCERIN (NITROSTAT) 0.4 MG SL tablet Place 1 tablet (0.4 mg total) under the tongue every 5 (five) minutes as needed.  25 tablet  3    Past Medical History  Diagnosis Date  . Prinzmetal angina     Cardiac catheterization in 2000 with no CAD;  Echo 10/04: Mild AI, probable normal LV function;  Myoview 10/04: No ischemia, no scar, EF 74%  . RAS  (renal artery stenosis)     1-59% right renal artery and greater than 60% in 2 left renal arteries 2006  . Hyponatremia     secondary to  SIADH  . GERD (gastroesophageal reflux disease)   . Bronchiectasis without acute exacerbation   . Obstructive chronic bronchitis without exacerbation   . Hypertension   . Gout   . Chronic kidney disease   . Colon cancer     Status post sigmoid colectomy  . History of GI bleed     No past surgical history on file.  No family history on file.  History   Social History  . Marital Status: Widowed    Spouse Name: N/A    Number of Children: N/A  . Years of Education: N/A   Occupational History  . Not on file.   Social History Main Topics  . Smoking status: Never Smoker   . Smokeless tobacco: Never Used  . Alcohol Use: No  . Drug Use: No  . Sexually Active: Not on file   Other Topics Concern  . Not on file  Social History Narrative  . No narrative on file    Review of Systems:  All systems reviewed.  They are negative to the above problem except as previously stated.  Vital Signs: BP 130/70  Pulse 80  Ht 5' (1.524 m)  Wt 129 lb (58.514 kg)  BMI 25.19 kg/m2  Physical Exam Patient is in NAD HEENT:  Normocephalic, atraumatic. EOMI, PERRLA.  Neck: JVP is normal. No thyromegaly. No bruits.  Lungs: clear to auscultation. No rales no wheezes.  Heart: Regular rate and rhythm. Normal S1, S2. No S3.   No significant murmurs. PMI not displaced.  Abdomen:  Supple, nontender. Normal bowel sounds. No masses. No hepatomegaly.  Extremities:   Good distal pulses throughout. No lower extremity edema.  Musculoskeletal :moving all extremities.  Neuro:   alert and oriented x3.  CN II-XII grossly intact.   Assessment and Plan:  1.  Prinzmetal's angina.  Asymptomatic  Continue meds.  2.  SIADH  Followed by Dr. Rowan Blase.

## 2011-12-24 ENCOUNTER — Telehealth: Payer: Self-pay | Admitting: *Deleted

## 2011-12-24 NOTE — Telephone Encounter (Signed)
Called Beverely Low (pt's daughter) and advised that per letter from [employer] her mother needs a lipid panel completed. Dr.Ross agrees with above. She will have [facility] do labs in September and forward to Dr.Ross.

## 2012-05-15 ENCOUNTER — Ambulatory Visit: Payer: Medicare Other | Admitting: Pulmonary Disease

## 2012-05-30 ENCOUNTER — Telehealth: Payer: Self-pay | Admitting: Pulmonary Disease

## 2012-05-30 MED ORDER — FLUTICASONE-SALMETEROL 500-50 MCG/DOSE IN AEPB: 1.0000 | INHALATION_SPRAY | Freq: Two times a day (BID) | RESPIRATORY_TRACT | Status: DC

## 2012-05-30 NOTE — Telephone Encounter (Signed)
I spoke with daughter and is aware refill has been called in. She voiced her understanding and nothing further was needed

## 2012-06-15 ENCOUNTER — Ambulatory Visit (INDEPENDENT_AMBULATORY_CARE_PROVIDER_SITE_OTHER): Payer: Medicare Other | Admitting: Pulmonary Disease

## 2012-06-15 ENCOUNTER — Encounter: Payer: Self-pay | Admitting: Pulmonary Disease

## 2012-06-15 VITALS — BP 122/64 | HR 83 | Temp 97.6°F | Ht 60.0 in | Wt 127.8 lb

## 2012-06-15 DIAGNOSIS — J449 Chronic obstructive pulmonary disease, unspecified: Secondary | ICD-10-CM

## 2012-06-15 DIAGNOSIS — J479 Bronchiectasis, uncomplicated: Secondary | ICD-10-CM

## 2012-06-15 MED ORDER — FLUTICASONE-SALMETEROL 500-50 MCG/DOSE IN AEPB: 1.0000 | INHALATION_SPRAY | Freq: Two times a day (BID) | RESPIRATORY_TRACT | Status: DC

## 2012-06-15 NOTE — Assessment & Plan Note (Signed)
The patient and her daughter tells me that she has not had an acute exacerbation since the last visit.  Her cough is minimal, and her quantity of mucus is at her usual baseline.

## 2012-06-15 NOTE — Addendum Note (Signed)
Addended by: Orma Flaming D on: 06/15/2012 12:33 PM   Modules accepted: Orders

## 2012-06-15 NOTE — Progress Notes (Signed)
  Subjective:    Patient ID: [name], female    DOB: Jan 21, 1915, 79 y.o.   MRN: [id number]  HPI Patient comes in today for followup of her known bronchiectasis with underlying COPD.  She has done extremely well since the last visit, with no acute exacerbation of either issue.  She has minimal cough and has not seen an increase in mucus production.  She feels her breathing is at a stable baseline.   Review of Systems  Constitutional: Negative for fever and unexpected weight change.  HENT: Negative for ear pain, nosebleeds, congestion, sore throat, rhinorrhea, sneezing, trouble swallowing, dental problem, postnasal drip and sinus pressure.   Eyes: Negative for redness and itching.  Respiratory: Negative for cough, chest tightness, shortness of breath and wheezing.   Cardiovascular: Positive for leg swelling ( SIADH -- ankle swelling). Negative for palpitations.  Gastrointestinal: Negative for nausea and vomiting.  Genitourinary: Negative for dysuria.  Musculoskeletal: Negative for joint swelling.       Arthritis and gout  Skin: Negative for rash.  Neurological: Negative for headaches.  Hematological: Does not bruise/bleed easily.  Psychiatric/Behavioral: Negative for dysphoric mood. The patient is not nervous/anxious.        Objective:   Physical Exam Frail appearing female in no acute distress Nose without purulence or discharge noted Neck without lymphadenopathy or thyromegaly Chest with mildly decreased breath sounds, no wheezes or rhonchi noted. Cardiac exam with regular rate and rhythm Lower extremities without significant edema, no cyanosis Alert and oriented, moves all 4 extremities.       Assessment & Plan:

## 2012-06-15 NOTE — Patient Instructions (Addendum)
Stay on current breathing medications.  Will send in refills for you.  Stay as active as possible. followup with me in one year if doing well.

## 2012-06-15 NOTE — Assessment & Plan Note (Signed)
The patient is doing very well on her current breathing regimen, and has not had any acute exacerbations her worsening dyspnea on exertion.  I've asked her to continue on her current regimen.

## 2012-06-22 ENCOUNTER — Ambulatory Visit: Payer: Medicare Other | Admitting: Internal Medicine

## 2012-09-28 ENCOUNTER — Ambulatory Visit (INDEPENDENT_AMBULATORY_CARE_PROVIDER_SITE_OTHER): Payer: Medicare Other | Admitting: Internal Medicine

## 2012-09-28 VITALS — BP 130/78 | HR 80 | Ht 60.0 in | Wt 126.0 lb

## 2012-09-28 DIAGNOSIS — I119 Hypertensive heart disease without heart failure: Secondary | ICD-10-CM

## 2012-09-28 LAB — CBC WITH DIFFERENTIAL/PLATELET
Basophils Absolute: 0.1 10*3/uL (ref 0.0–0.1)
HCT: 35.7 % — ABNORMAL LOW (ref 36.0–46.0)
Hemoglobin: 11.8 g/dL — ABNORMAL LOW (ref 12.0–15.0)
Lymphs Abs: 1.5 10*3/uL (ref 0.7–4.0)
MCV: 92.9 fl (ref 78.0–100.0)
Monocytes Absolute: 0.9 10*3/uL (ref 0.1–1.0)
Monocytes Relative: 15 % — ABNORMAL HIGH (ref 3.0–12.0)
Neutro Abs: 2.1 10*3/uL (ref 1.4–7.7)
Platelets: 230 10*3/uL (ref 150.0–400.0)
RDW: 17.5 % — ABNORMAL HIGH (ref 11.5–14.6)

## 2012-09-28 NOTE — Patient Instructions (Addendum)
CBC today  Your physician wants you to follow-up in: 1 year with Dr. Tenny Craw.  You will receive a reminder letter in the mail two months in advance. If you don't receive a letter, please call our office to schedule the follow-up appointment.

## 2012-09-28 NOTE — Progress Notes (Signed)
HPI Patient is a 79 yo with HTN and Prinzmetal's angina.  I saw her in May 2013.   Since seen she has done fairly well.  Her daughter says she has had about 5 episodes of CP  At least 3 of 5 were when she forgot to take her meds. She has stable SOB with activity  Sleeping well.   Allergies  Allergen Reactions  . Ampicillin   . Esomeprazole Magnesium   . Levofloxacin   . Pantoprazole Sodium   . Penicillins   . Tetracycline     Current Outpatient Prescriptions  Medication Sig Dispense Refill  . allopurinol (ZYLOPRIM) 100 MG tablet Take 100 mg by mouth daily.        Marland Kitchen amLODipine (NORVASC) 5 MG tablet Take 1 tablet (5 mg total) by mouth 2 (two) times daily.  60 tablet  11  . azelastine (OPTIVAR) 0.05 % ophthalmic solution 1 drop 2 (two) times daily.        . clopidogrel (PLAVIX) 75 MG tablet Take 1 tablet (75 mg total) by mouth daily.  30 tablet  11  . colchicine 0.6 MG tablet Take 0.6 mg by mouth every other day.        . diphenoxylate-atropine (LOMOTIL) 2.5-0.025 MG per tablet Take 1 tablet by mouth 4 (four) times daily as needed.        . Fluticasone-Salmeterol (ADVAIR DISKUS) 500-50 MCG/DOSE AEPB Inhale 1 puff into the lungs 2 (two) times daily.  60 each  11  . folic acid (FOLVITE) 1 MG tablet Take 1 mg by mouth daily.       . isosorbide mononitrate (IMDUR) 30 MG 24 hr tablet Take 1 tablet (30 mg total) by mouth daily.  30 tablet  11  . Multiple Vitamins-Minerals (OCUVITE PRESERVISION) TABS Take by mouth 2 (two) times daily.        . nitroGLYCERIN (NITROSTAT) 0.4 MG SL tablet Place 1 tablet (0.4 mg total) under the tongue every 5 (five) minutes as needed.  25 tablet  3   No current facility-administered medications for this visit.    Past Medical History  Diagnosis Date  . Prinzmetal angina     Cardiac catheterization in 2000 with no CAD;  Echo 10/04: Mild AI, probable normal LV function;  Myoview 10/04: No ischemia, no scar, EF 74%  . RAS (renal artery stenosis)     1-59% right  renal artery and greater than 60% in 2 left renal arteries 2006  . Hyponatremia     secondary to  SIADH  . GERD (gastroesophageal reflux disease)   . Bronchiectasis without acute exacerbation   . Obstructive chronic bronchitis without exacerbation   . Hypertension   . Gout   . Chronic kidney disease   . Colon cancer     Status post sigmoid colectomy  . History of GI bleed     No past surgical history on file.  No family history on file.  History   Social History  . Marital Status: Widowed    Spouse Name: N/A    Number of Children: N/A  . Years of Education: N/A   Occupational History  . Not on file.   Social History Main Topics  . Smoking status: Never Smoker   . Smokeless tobacco: Never Used  . Alcohol Use: No  . Drug Use: No  . Sexually Active: Not on file   Other Topics Concern  . Not on file   Social History Narrative  . No narrative on  file    Review of Systems:  All systems reviewed.  They are negative to the above problem except as previously stated.  Vital Signs: BP 130/78  Pulse 80  Ht 5' (1.524 m)  Wt 126 lb (57.153 kg)  BMI 24.61 kg/m2  Physical Exam Patient is in NAD HEENT:  Normocephalic, atraumatic. EOMI, PERRLA.  Neck: JVP is normal.  No bruits.  Lungs: clear to auscultation. No rales no wheezes.  Heart: Regular rate and rhythm. Normal S1, S2. No S3.   No significant murmurs. PMI not displaced.  Abdomen:  Supple, nontender. Normal bowel sounds. No masses. No hepatomegaly.  Extremities:   Good distal pulses throughout. No lower extremity edema.  Musculoskeletal :moving all extremities.  Neuro:   alert and oriented x3.  CN II-XII grossly intact.  EKG  SR 80  Occasional PVC Assessment and Plan:  1.  Cardiac  I would keep on same regimen  Appears to be tolerating and for most part is controlling symptoms I would follow up in 1 year  2.  HTN  Good control

## 2012-10-05 ENCOUNTER — Other Ambulatory Visit: Payer: Self-pay | Admitting: Internal Medicine

## 2012-10-12 ENCOUNTER — Other Ambulatory Visit: Payer: Self-pay | Admitting: Internal Medicine

## 2013-06-20 ENCOUNTER — Ambulatory Visit (INDEPENDENT_AMBULATORY_CARE_PROVIDER_SITE_OTHER): Payer: Medicare Other | Admitting: Pulmonary Disease

## 2013-06-20 ENCOUNTER — Encounter: Payer: Self-pay | Admitting: Pulmonary Disease

## 2013-06-20 ENCOUNTER — Encounter (INDEPENDENT_AMBULATORY_CARE_PROVIDER_SITE_OTHER): Payer: Self-pay

## 2013-06-20 VITALS — BP 128/76 | HR 80 | Temp 97.6°F | Ht 59.0 in | Wt 124.0 lb

## 2013-06-20 DIAGNOSIS — J449 Chronic obstructive pulmonary disease, unspecified: Secondary | ICD-10-CM

## 2013-06-20 DIAGNOSIS — J479 Bronchiectasis, uncomplicated: Secondary | ICD-10-CM

## 2013-06-20 MED ORDER — FLUTICASONE-SALMETEROL 500-50 MCG/DOSE IN AEPB: 1.0000 | INHALATION_SPRAY | Freq: Two times a day (BID) | RESPIRATORY_TRACT | Status: DC

## 2013-06-20 NOTE — Assessment & Plan Note (Signed)
The pt is doing well with advair, and feels her breathing is at a good baseline.  She is to continue on her maintenance meds.

## 2013-06-20 NOTE — Assessment & Plan Note (Signed)
The patient is doing very well from a bronchiectasis standpoint, and has not had a pulmonary infection since the last visit. She is trying to be extremely careful in avoiding those who are infected, and using alcohol hand gel religiously.

## 2013-06-20 NOTE — Patient Instructions (Signed)
No change in medications. Continue to be careful to avoid infections. followup with me again in one year if doing well.

## 2013-06-20 NOTE — Addendum Note (Signed)
Addended by: Virl Cagey on: 06/20/2013 02:16 PM   Modules accepted: Orders

## 2013-06-20 NOTE — Progress Notes (Signed)
   Subjective:    Patient ID: [name], female    DOB: 09-Dec-1914, 80 y.o.   MRN: [id number]  HPI The patient comes in today for followup of her known bronchiectasis with underlying COPD. She has done well from a pulmonary standpoint since the last visit, and has had no acute exacerbation or worsening shortness of breath. She is continuing on her Advair, and feels this has helped tremendously. Her hyponatremia is being followed by her primary care doctor and nephrology.   Review of Systems  Constitutional: Negative for fever and unexpected weight change.  HENT: Negative for congestion, dental problem, ear pain, nosebleeds, postnasal drip, rhinorrhea, sinus pressure, sneezing, sore throat and trouble swallowing.   Eyes: Negative for redness and itching.  Respiratory: Negative for cough, chest tightness, shortness of breath and wheezing.   Cardiovascular: Negative for palpitations and leg swelling.  Gastrointestinal: Negative for nausea and vomiting.  Genitourinary: Negative for dysuria.  Musculoskeletal: Negative for joint swelling.  Skin: Negative for rash.  Neurological: Negative for headaches.  Hematological: Does not bruise/bleed easily.  Psychiatric/Behavioral: Negative for dysphoric mood. The patient is not nervous/anxious.        Objective:   Physical Exam Frail-appearing female in no acute distress Nose without purulence or discharge noted Neck without lymphadenopathy or thyromegaly Chest with very clear breath sounds bilaterally, no crackles or wheezes Cardiac exam with regular rate and rhythm Lower extremities with 1-2+ edema, no cyanosis Alert and oriented, moves all 4 extremities.       Assessment & Plan:

## 2013-07-08 ENCOUNTER — Other Ambulatory Visit: Payer: Self-pay | Admitting: Pulmonary Disease

## 2013-09-18 ENCOUNTER — Other Ambulatory Visit: Payer: Self-pay | Admitting: Internal Medicine

## 2013-10-02 ENCOUNTER — Other Ambulatory Visit: Payer: Self-pay | Admitting: Internal Medicine

## 2013-10-08 ENCOUNTER — Other Ambulatory Visit: Payer: Self-pay | Admitting: Internal Medicine

## 2013-10-10 ENCOUNTER — Other Ambulatory Visit: Payer: Self-pay | Admitting: Internal Medicine

## 2013-10-15 ENCOUNTER — Other Ambulatory Visit: Payer: Self-pay | Admitting: Internal Medicine

## 2013-11-12 ENCOUNTER — Other Ambulatory Visit: Payer: Self-pay | Admitting: Internal Medicine

## 2013-12-10 ENCOUNTER — Other Ambulatory Visit: Payer: Self-pay | Admitting: Internal Medicine

## 2013-12-17 ENCOUNTER — Other Ambulatory Visit: Payer: Self-pay

## 2013-12-17 ENCOUNTER — Telehealth: Payer: Self-pay | Admitting: Internal Medicine

## 2013-12-17 DIAGNOSIS — I201 Angina pectoris with documented spasm: Secondary | ICD-10-CM

## 2013-12-17 MED ORDER — CLOPIDOGREL BISULFATE 75 MG PO TABS: ORAL_TABLET | ORAL | Status: DC

## 2013-12-17 MED ORDER — AMLODIPINE BESYLATE 5 MG PO TABS: ORAL_TABLET | ORAL | Status: DC

## 2013-12-17 MED ORDER — ALLOPURINOL 100 MG PO TABS: 100.0000 mg | ORAL_TABLET | Freq: Every day | ORAL | Status: DC

## 2013-12-17 MED ORDER — ISOSORBIDE MONONITRATE ER 30 MG PO TB24: ORAL_TABLET | ORAL | Status: DC

## 2013-12-17 MED ORDER — NITROGLYCERIN 0.4 MG SL SUBL: 0.4000 mg | SUBLINGUAL_TABLET | SUBLINGUAL | Status: AC | PRN

## 2013-12-17 NOTE — Telephone Encounter (Signed)
No message needed, transferred to refills

## 2013-12-25 ENCOUNTER — Other Ambulatory Visit: Payer: Self-pay | Admitting: Internal Medicine

## 2014-02-06 NOTE — Progress Notes (Signed)
HPI Patient is a 80 yo with HTN and Prinzmetal's angina.  I saw her in April 2014 SInce seen she has done well Denies CP  No SOB  Does get tired Labs at [employer] office Daughter says if she does not take imdur will get a little foggy in thinking.  Improves after   Allergies  Allergen Reactions  . Ampicillin   . Avelox [Moxifloxacin]     Tremors   . Esomeprazole Magnesium   . Levofloxacin     Tremors, stomach ache, gait disturbance, inability to sleep.   . Other     Corn mold.  . Pantoprazole Sodium   . Penicillins   . Tetracycline     Current Outpatient Prescriptions  Medication Sig Dispense Refill  . allopurinol (ZYLOPRIM) 100 MG tablet Take 1 tablet (100 mg total) by mouth daily.  30 tablet  1  . amLODipine (NORVASC) 5 MG tablet take 1 tablet by mouth twice a day  60 tablet  1  . azelastine (OPTIVAR) 0.05 % ophthalmic solution 1 drop 2 (two) times daily.        . clopidogrel (PLAVIX) 75 MG tablet take 1 tablet by mouth once daily  30 tablet  1  . colchicine 0.6 MG tablet Take 0.6 mg by mouth every other day.        . diphenoxylate-atropine (LOMOTIL) 2.5-0.025 MG per tablet Take 1 tablet by mouth 4 (four) times daily as needed.        . Fluticasone-Salmeterol (ADVAIR DISKUS) 500-50 MCG/DOSE AEPB Inhale 1 puff into the lungs 2 (two) times daily.  60 each  12  . folic acid (FOLVITE) 1 MG tablet take 1 tablet by mouth once daily  30 tablet  3  . isosorbide mononitrate (IMDUR) 30 MG 24 hr tablet take 1 tablet by mouth once daily  30 tablet  1  . Multiple Vitamins-Minerals (OCUVITE PRESERVISION) TABS Take by mouth 2 (two) times daily.        . nitroGLYCERIN (NITROSTAT) 0.4 MG SL tablet Place 1 tablet (0.4 mg total) under the tongue every 5 (five) minutes as needed.  25 tablet  1   No current facility-administered medications for this visit.    Past Medical History  Diagnosis Date  . Prinzmetal angina     Cardiac catheterization in 2000 with no CAD;  Echo 10/04: Mild AI,  probable normal LV function;  Myoview 10/04: No ischemia, no scar, EF 74%  . RAS (renal artery stenosis)     1-59% right renal artery and greater than 60% in 2 left renal arteries 2006  . Hyponatremia     secondary to  SIADH  . GERD (gastroesophageal reflux disease)   . Bronchiectasis without acute exacerbation   . Obstructive chronic bronchitis without exacerbation   . Hypertension   . Gout   . Chronic kidney disease   . Colon cancer     Status post sigmoid colectomy  . History of GI bleed     No past surgical history on file.  No family history on file.  History   Social History  . Marital Status: Widowed    Spouse Name: N/A    Number of Children: N/A  . Years of Education: N/A   Occupational History  . Not on file.   Social History Main Topics  . Smoking status: Never Smoker   . Smokeless tobacco: Never Used  . Alcohol Use: No  . Drug Use: No  . Sexual Activity: Not on file  Other Topics Concern  . Not on file   Social History Narrative  . No narrative on file    Review of Systems:  All systems reviewed.  They are negative to the above problem except as previously stated.  Vital Signs: BP 118/58  Pulse 78  Ht 4\' 11"  (1.499 m)  Wt 118 lb (53.524 kg)  BMI 23.82 kg/m2  Physical Exam Patient is in NAD HEENT:  Normocephalic, atraumatic. EOMI, PERRLA.  Neck: JVP is normal.  No bruits.  Lungs: clear to auscultation. No rales no wheezes.  Heart: Regular rate and rhythm. Normal S1, S2. No S3.   No significant murmurs. PMI not displaced.  Abdomen:  Supple, nontender. Normal bowel sounds. No masses. No hepatomegaly.  Extremities:   Good distal pulses throughout. No lower extremity edema.  Musculoskeletal :moving all extremities.  Neuro:   alert and oriented x3.  CN II-XII grossly intact.  EKG  SR 78  Occasional PVC  RBBB  Nonspecific ST T wave changes   Assessment and Plan:  1.  Cardiac Doing well  Keep on same regimen  2.  HTN  Good control  Continue  meds.     F/U in 1 year

## 2014-02-07 ENCOUNTER — Ambulatory Visit (INDEPENDENT_AMBULATORY_CARE_PROVIDER_SITE_OTHER): Payer: Medicare Other | Admitting: Internal Medicine

## 2014-02-07 ENCOUNTER — Encounter: Payer: Self-pay | Admitting: Internal Medicine

## 2014-02-07 VITALS — BP 118/58 | HR 78 | Ht 59.0 in | Wt 118.0 lb

## 2014-02-07 DIAGNOSIS — I201 Angina pectoris with documented spasm: Secondary | ICD-10-CM

## 2014-02-07 DIAGNOSIS — I119 Hypertensive heart disease without heart failure: Secondary | ICD-10-CM

## 2014-02-07 MED ORDER — AZELASTINE HCL 0.05 % OP SOLN: 1.0000 [drp] | Freq: Two times a day (BID) | OPHTHALMIC | Status: DC

## 2014-02-07 MED ORDER — ISOSORBIDE MONONITRATE ER 30 MG PO TB24: ORAL_TABLET | ORAL | Status: DC

## 2014-02-07 MED ORDER — AMLODIPINE BESYLATE 5 MG PO TABS: ORAL_TABLET | ORAL | Status: DC

## 2014-02-07 MED ORDER — CLOPIDOGREL BISULFATE 75 MG PO TABS: ORAL_TABLET | ORAL | Status: DC

## 2014-02-07 NOTE — Patient Instructions (Signed)
Your physician wants you to follow-up in: 1 year with Dr. Ross. You will receive a reminder letter in the mail two months in advance. If you don't receive a letter, please call our office to schedule the follow-up appointment.  Your physician recommends that you continue on your current medications as directed. Please refer to the Current Medication list given to you today.  

## 2014-02-12 ENCOUNTER — Other Ambulatory Visit: Payer: Self-pay | Admitting: Internal Medicine

## 2014-06-21 ENCOUNTER — Ambulatory Visit: Payer: Medicare Other | Admitting: Pulmonary Disease

## 2014-06-24 ENCOUNTER — Telehealth: Payer: Self-pay | Admitting: Pulmonary Disease

## 2014-06-24 NOTE — Telephone Encounter (Signed)
lmtcb for jeannie (pt's daughter)

## 2014-06-24 NOTE — Telephone Encounter (Signed)
lmomtcb x1 

## 2014-06-24 NOTE — Telephone Encounter (Signed)
[phone], patient daughter cb

## 2014-06-25 ENCOUNTER — Ambulatory Visit: Payer: Medicare Other | Admitting: Pulmonary Disease

## 2014-06-25 MED ORDER — FLUTICASONE-SALMETEROL 500-50 MCG/DOSE IN AEPB: 1.0000 | INHALATION_SPRAY | Freq: Two times a day (BID) | RESPIRATORY_TRACT | Status: DC

## 2014-06-25 NOTE — Telephone Encounter (Signed)
Spoke with pt daughter, needing Advair refilled to [facility].  Scheduled 07/08/14 for OV with [facility]. Nothing further needed.

## 2014-07-08 ENCOUNTER — Ambulatory Visit (INDEPENDENT_AMBULATORY_CARE_PROVIDER_SITE_OTHER): Payer: Medicare Other | Admitting: Pulmonary Disease

## 2014-07-08 ENCOUNTER — Other Ambulatory Visit (INDEPENDENT_AMBULATORY_CARE_PROVIDER_SITE_OTHER): Payer: Medicare Other

## 2014-07-08 ENCOUNTER — Encounter: Payer: Self-pay | Admitting: Pulmonary Disease

## 2014-07-08 VITALS — BP 126/68 | HR 80 | Temp 97.0°F | Ht 59.0 in | Wt 117.0 lb

## 2014-07-08 DIAGNOSIS — J479 Bronchiectasis, uncomplicated: Secondary | ICD-10-CM

## 2014-07-08 DIAGNOSIS — J449 Chronic obstructive pulmonary disease, unspecified: Secondary | ICD-10-CM

## 2014-07-08 DIAGNOSIS — E871 Hypo-osmolality and hyponatremia: Secondary | ICD-10-CM

## 2014-07-08 LAB — BASIC METABOLIC PANEL
BUN: 34 mg/dL — ABNORMAL HIGH (ref 6–23)
CHLORIDE: 104 meq/L (ref 96–112)
CO2: 27 meq/L (ref 19–32)
Calcium: 9 mg/dL (ref 8.4–10.5)
Creatinine, Ser: 1.07 mg/dL (ref 0.40–1.20)
GFR: 50.36 mL/min — ABNORMAL LOW (ref >60.00)
Glucose, Bld: 99 mg/dL (ref 70–99)
POTASSIUM: 4.4 meq/L (ref 3.5–5.1)
SODIUM: 135 meq/L (ref 135–145)

## 2014-07-08 NOTE — Patient Instructions (Signed)
Continue on your advair, and let us know if you have worsening symptoms. Will check your electrolytes today, and will fax to your kidney doctor followup with me again in one year.

## 2014-07-08 NOTE — Progress Notes (Signed)
   Subjective:    Patient ID: [name], female    DOB: 1914/05/28, 81 y.o.   MRN: [id number]  HPI The patient comes in today for follow-up of her known bronchiectasis with moderate COPD. She has done well from a pulmonary standpoint since the last visit, as long as she stays on her Advair consistently. She denies any significant cough, chest congestion, or mucus production. She also feels that her breathing is adequate. Her daughter has asked that we try and spread her pulmonary visits out as much as possible, given her frailty and difficulty making her doctor appointments.   Review of Systems  Constitutional: Negative for fever and unexpected weight change.  HENT: Negative for congestion, dental problem, ear pain, nosebleeds, postnasal drip, rhinorrhea, sinus pressure, sneezing, sore throat and trouble swallowing.   Eyes: Negative for redness and itching.  Respiratory: Positive for shortness of breath. Negative for cough, chest tightness and wheezing.   Cardiovascular: Positive for leg swelling. Negative for palpitations.  Gastrointestinal: Negative for nausea and vomiting.  Genitourinary: Negative for dysuria.  Musculoskeletal: Negative for joint swelling.  Skin: Negative for rash.  Neurological: Negative for headaches.  Hematological: Does not bruise/bleed easily.  Psychiatric/Behavioral: Negative for dysphoric mood. The patient is not nervous/anxious.        Objective:   Physical Exam Frail-appearing female in no acute distress Nose without purulence or discharge noted Neck without lymphadenopathy or thyromegaly Chest with mildly decreased breath sounds, but no definite crackles or wheezes Cardiac exam with distant but regular rhythm Lower extremities with trace ankle edema, no cyanosis Alert and oriented, moves all 4 extremities.       Assessment & Plan:

## 2014-07-08 NOTE — Assessment & Plan Note (Signed)
The patient has moderate airflow limitation related to her bronchiectasis, and continues to do well on her Advair.

## 2014-07-08 NOTE — Assessment & Plan Note (Signed)
The patient feels that she is doing very well from a bronchiectasis standpoint, with no recent pulmonary infection, and very little cough with mucus as long as she stays on the Advair consistently.

## 2014-08-31 ENCOUNTER — Other Ambulatory Visit: Payer: Self-pay | Admitting: Pulmonary Disease

## 2014-09-02 ENCOUNTER — Telehealth: Payer: Self-pay | Admitting: Pulmonary Disease

## 2014-09-02 MED ORDER — FLUTICASONE-SALMETEROL 500-50 MCG/DOSE IN AEPB: INHALATION_SPRAY | RESPIRATORY_TRACT | Status: AC

## 2014-09-02 NOTE — Telephone Encounter (Signed)
Rx has been sent in. Pt's daughter is aware. Nothing further was needed.

## 2014-09-24 ENCOUNTER — Telehealth: Payer: Self-pay | Admitting: Pulmonary Disease

## 2014-09-24 MED ORDER — PREDNISONE 10 MG PO TABS: ORAL_TABLET | ORAL | Status: DC

## 2014-09-24 NOTE — Telephone Encounter (Signed)
Spoke with pt's daughter. Reports that pt is choking her mucus at night time. Denies chest tightness, SOB. Mucus is clear but thick and "bubbly." They would like prednisone to be sent in.  [facility] - please advise.

## 2014-09-24 NOTE — Telephone Encounter (Signed)
Spoke with pt's daughter/poa, she is aware of recs.  Prednisone sent to pharmacy.  Nothing further needed.

## 2014-09-24 NOTE — Telephone Encounter (Signed)
Ok to send in 6 day prednisone taper (40/40/30/30/20/20)

## 2014-11-21 ENCOUNTER — Telehealth: Payer: Self-pay

## 2014-11-21 ENCOUNTER — Other Ambulatory Visit: Payer: Self-pay | Admitting: Internal Medicine

## 2014-11-21 NOTE — Telephone Encounter (Signed)
November 13, 2014, Dr.Truslow prescribed Allopurinol 300 mg twice daily, November 21, 2014, Dr. Charlestine Night prescribed 100 mg twice daily, and also November 21, 2014 you prescribed 100 mg once a daily.  [facility] called for clarification.  Please advise.

## 2014-11-21 NOTE — Telephone Encounter (Signed)
On 11/13/14 Dr.Truslow sent in Rx to [facility] for

## 2014-11-21 NOTE — Telephone Encounter (Signed)
Start at 100 mg 2x per day See how feels at end of a few wks   Review with Dr Charlestine Night for gout response

## 2014-11-22 MED ORDER — ALLOPURINOL 100 MG PO TABS: 100.0000 mg | ORAL_TABLET | Freq: Two times a day (BID) | ORAL | Status: AC

## 2014-11-22 NOTE — Telephone Encounter (Signed)
Informed pharmacist at [facility] to fill for 100 mg 2x per day. Called patient's daughter to inform. She said [facility] called the wrong office.  This was already handled through Dr. Elmon Else office. She is appreciative for the follow up.

## 2015-02-11 ENCOUNTER — Encounter: Payer: Self-pay | Admitting: Internal Medicine

## 2015-02-13 ENCOUNTER — Encounter: Payer: Self-pay | Admitting: Internal Medicine

## 2015-02-13 ENCOUNTER — Ambulatory Visit (INDEPENDENT_AMBULATORY_CARE_PROVIDER_SITE_OTHER): Payer: Medicare Other | Admitting: Internal Medicine

## 2015-02-13 VITALS — BP 102/62 | HR 90 | Resp 95 | Ht 59.0 in | Wt 108.6 lb

## 2015-02-13 DIAGNOSIS — I10 Essential (primary) hypertension: Secondary | ICD-10-CM

## 2015-02-13 NOTE — Patient Instructions (Signed)
Your physician recommends that you continue on your current medications as directed. Please refer to the Current Medication list given to you today. Your physician recommends that you schedule a follow-up appointment in: as needed with Dr. Ross.   

## 2015-02-13 NOTE — Progress Notes (Signed)
Cardiology Office Note   Date:  02/13/2015   ID:  KOREE STAHELI, DOB 1914-07-05, MRN [id number]  PCP:  Marjorie Smolder, MD  Cardiologist:   Dorris Carnes, MD   No chief complaint on file.    F/U of HTN and prinzmetals History of Present Illness: [name] is a 81 y.o. female with a history of HTN,moderate COPDand Prinzmetal's angina  Last seen in Oct 2015   Since I saw her she has less energy  Gives out easy  Sleeping good  No PND  Does get SOB with activity  No CP   Taste down  Appetite down  Eating less.  Wt down. Unsteady on feet  Has fallen some       Current Outpatient Prescriptions  Medication Sig Dispense Refill  . allopurinol (ZYLOPRIM) 100 MG tablet Take 1 tablet (100 mg total) by mouth 2 (two) times daily. 30 tablet 3  . amLODipine (NORVASC) 5 MG tablet take 1 tablet by mouth twice a day (Patient taking differently: 7.5 mg. take 1 tablet by mouth twice a day) 60 tablet 11  . azelastine (OPTIVAR) 0.05 % ophthalmic solution Apply 1 drop to eye 2 (two) times daily. 6 mL 6  . clopidogrel (PLAVIX) 75 MG tablet take 1 tablet by mouth once daily 30 tablet 11  . colchicine 0.6 MG tablet Take 0.6 mg by mouth every other day.      . diphenoxylate-atropine (LOMOTIL) 2.5-0.025 MG per tablet Take 1 tablet by mouth 4 (four) times daily as needed.      . Fluticasone-Salmeterol (ADVAIR DISKUS) 500-50 MCG/DOSE AEPB INHALE 1 PUFF INTO THE LUNGS TWICE A DAY 60 each 5  . folic acid (FOLVITE) 1 MG tablet take 1 tablet by mouth once daily 30 tablet 11  . isosorbide mononitrate (IMDUR) 30 MG 24 hr tablet take 1 tablet by mouth once daily 30 tablet 11  . Multiple Vitamins-Minerals (OCUVITE PRESERVISION) TABS Take by mouth daily.     . nitroGLYCERIN (NITROSTAT) 0.4 MG SL tablet Place 1 tablet (0.4 mg total) under the tongue every 5 (five) minutes as needed. 25 tablet 1  . traMADol (ULTRAM) 50 MG tablet Take 50 mg by mouth every 4 (four) hours as needed.     No current  facility-administered medications for this visit.    Allergies:   Other; Avelox; Levofloxacin; Pantoprazole sodium; Ampicillin; Esomeprazole magnesium; Penicillins; and Tetracycline   Past Medical History  Diagnosis Date  . Prinzmetal angina ([place])     Cardiac catheterization in 2000 with no CAD;  Echo 10/04: Mild AI, probable normal LV function;  Myoview 10/04: No ischemia, no scar, EF 74%  . RAS (renal artery stenosis) ([facility])     1-59% right renal artery and greater than 60% in 2 left renal arteries 2006  . Hyponatremia     secondary to  SIADH  . GERD (gastroesophageal reflux disease)   . Bronchiectasis without acute exacerbation ([place])   . Obstructive chronic bronchitis without exacerbation ([place])   . Hypertension   . Gout   . Chronic kidney disease   . Colon cancer [facility])     Status post sigmoid colectomy  . History of GI bleed     No past surgical history on file.   Social History:  The patient  reports that she has never smoked. She has never used smokeless tobacco. She reports that she does not drink alcohol or use illicit drugs.   Family History:  The patient's family history includes  Hypertension in her mother; Pancreatic cancer in her mother.    ROS:  Please see the history of present illness. All other systems are reviewed and  Negative to the above problem except as noted.    PHYSICAL EXAM: VS:  BP 102/62 mmHg  Pulse 90  Resp 95  Ht 4\' 11"  (1.499 m)  Wt 108 lb 9.6 oz (49.261 kg)  BMI 21.92 kg/m2  GEN: Thin 81 yo  in no acute distressExamined in WC   HEENT: normal Neck: no JVD, carotid bruits, or masses Cardiac: RRR; no murmurs, rubs, or gallops,no edema  Respiratory:  clear to auscultation bilaterally, normal work of breathing GI: soft, nontender, nondistended, + BS  No hepatomegaly  MS: no deformity Moving all extremities   Skin: warm and dry, no rash Neuro:  Strength and sensation are intact Psych: euthymic mood, full affect   EKG:  EKG is ordered today.   Sr 90 bpm  Biatrial enlargment  RBBB  Poss AWMI     Lipid Panel No results found for: CHOL, TRIG, HDL, CHOLHDL, VLDL, LDLCALC, LDLDIRECT    Wt Readings from Last 3 Encounters:  02/13/15 108 lb 9.6 oz (49.261 kg)  07/08/14 117 lb (53.071 kg)  02/07/14 118 lb (53.524 kg)      ASSESSMENT AND PLAN:  1.  HTN  BP OK  It is labile at home  Will go to 170s/  Keep on same regimen for now.  Will need to follow and dose meds as tolerates / needs  2.  Hx prinzmetal's angina  Rel asymptomatic  3.  SOB  May be due to debility, wt loss  Cannot exlude CAD  With age, plan on conservative Rx  Keep comfortable  Pt is followed by Carolynne Edouard  I will be availabe for questions  Or if she develops more definite cardiac symptoms    Signed, Dorris Carnes, MD  02/13/2015 2:57 PM    [facility] [place], [place], [place]  [address] Phone: [phone]; Fax: [phone]

## 2015-02-23 ENCOUNTER — Other Ambulatory Visit: Payer: Self-pay | Admitting: Internal Medicine

## 2015-03-04 ENCOUNTER — Other Ambulatory Visit: Payer: Self-pay | Admitting: Internal Medicine

## 2015-03-05 NOTE — Telephone Encounter (Signed)
Please advise on amlodipine dose. It was last sent in with two different sigs, 5mg  bid and also mentions 7.5mg . Also, ok to refill folic acid? Thanks, MI

## 2015-04-22 ENCOUNTER — Other Ambulatory Visit: Payer: Self-pay | Admitting: Internal Medicine

## 2015-04-26 ENCOUNTER — Other Ambulatory Visit: Payer: Self-pay | Admitting: Internal Medicine

## 2015-06-11 DEATH — deceased

## 2015-07-14 ENCOUNTER — Ambulatory Visit: Payer: Medicare Other | Admitting: Pulmonary Disease
